# Patient Record
Sex: Female | Born: 1945 | Race: White | Hispanic: No | Marital: Married | Smoking: Current every day smoker
Health system: Southern US, Community
[De-identification: ages and names within clinical notes are randomized; demographics above are authoritative.]

## PROBLEM LIST (undated history)

## (undated) DIAGNOSIS — Z9289 Personal history of other medical treatment: Secondary | ICD-10-CM

## (undated) DIAGNOSIS — D649 Anemia, unspecified: Secondary | ICD-10-CM

## (undated) DIAGNOSIS — F419 Anxiety disorder, unspecified: Secondary | ICD-10-CM

## (undated) DIAGNOSIS — I4891 Unspecified atrial fibrillation: Secondary | ICD-10-CM

## (undated) DIAGNOSIS — M1990 Unspecified osteoarthritis, unspecified site: Secondary | ICD-10-CM

## (undated) DIAGNOSIS — E1142 Type 2 diabetes mellitus with diabetic polyneuropathy: Secondary | ICD-10-CM

## (undated) DIAGNOSIS — I10 Essential (primary) hypertension: Secondary | ICD-10-CM

## (undated) DIAGNOSIS — E119 Type 2 diabetes mellitus without complications: Secondary | ICD-10-CM

## (undated) DIAGNOSIS — I5032 Chronic diastolic (congestive) heart failure: Secondary | ICD-10-CM

---

## 1973-02-13 DIAGNOSIS — Z9289 Personal history of other medical treatment: Secondary | ICD-10-CM

## 1973-02-13 HISTORY — DX: Personal history of other medical treatment: Z92.89

## 1997-02-13 HISTORY — PX: CHOLECYSTECTOMY: SHX55

## 1998-01-11 ENCOUNTER — Encounter: Payer: Self-pay | Admitting: Emergency Medicine

## 1998-01-11 ENCOUNTER — Observation Stay (HOSPITAL_COMMUNITY): Admission: EM | Admit: 1998-01-11 | Discharge: 1998-01-12 | Payer: Self-pay | Admitting: Emergency Medicine

## 2000-02-14 HISTORY — PX: INCISION AND DRAINAGE ABSCESS: SHX5864

## 2000-08-11 ENCOUNTER — Inpatient Hospital Stay (HOSPITAL_COMMUNITY): Admission: EM | Admit: 2000-08-11 | Discharge: 2000-08-21 | Payer: Self-pay | Admitting: *Deleted

## 2000-08-11 ENCOUNTER — Encounter: Payer: Self-pay | Admitting: Family Medicine

## 2000-08-14 ENCOUNTER — Encounter: Payer: Self-pay | Admitting: Gastroenterology

## 2000-08-27 ENCOUNTER — Encounter: Admission: RE | Admit: 2000-08-27 | Discharge: 2000-08-27 | Payer: Self-pay | Admitting: Family Medicine

## 2000-08-28 ENCOUNTER — Encounter: Admission: RE | Admit: 2000-08-28 | Discharge: 2000-08-28 | Payer: Self-pay | Admitting: Family Medicine

## 2000-10-01 ENCOUNTER — Encounter: Admission: RE | Admit: 2000-10-01 | Discharge: 2000-10-01 | Payer: Self-pay | Admitting: Family Medicine

## 2001-10-10 ENCOUNTER — Encounter: Admission: RE | Admit: 2001-10-10 | Discharge: 2001-10-10 | Payer: Self-pay | Admitting: Family Medicine

## 2002-01-22 ENCOUNTER — Encounter: Admission: RE | Admit: 2002-01-22 | Discharge: 2002-01-22 | Payer: Self-pay | Admitting: Family Medicine

## 2002-06-13 ENCOUNTER — Encounter: Payer: Self-pay | Admitting: Emergency Medicine

## 2002-06-13 ENCOUNTER — Emergency Department (HOSPITAL_COMMUNITY): Admission: EM | Admit: 2002-06-13 | Discharge: 2002-06-13 | Payer: Self-pay | Admitting: Emergency Medicine

## 2003-12-21 ENCOUNTER — Encounter: Admission: RE | Admit: 2003-12-21 | Discharge: 2003-12-21 | Payer: Self-pay | Admitting: Family Medicine

## 2004-01-18 ENCOUNTER — Emergency Department (HOSPITAL_COMMUNITY): Admission: EM | Admit: 2004-01-18 | Discharge: 2004-01-18 | Payer: Self-pay | Admitting: Family Medicine

## 2004-05-10 ENCOUNTER — Ambulatory Visit: Payer: Self-pay | Admitting: Internal Medicine

## 2004-05-18 ENCOUNTER — Ambulatory Visit: Payer: Self-pay | Admitting: Internal Medicine

## 2004-06-11 ENCOUNTER — Observation Stay (HOSPITAL_COMMUNITY): Admission: EM | Admit: 2004-06-11 | Discharge: 2004-06-12 | Payer: Self-pay | Admitting: Family Medicine

## 2010-04-18 ENCOUNTER — Inpatient Hospital Stay (HOSPITAL_COMMUNITY)
Admission: AD | Admit: 2010-04-18 | Discharge: 2010-04-22 | DRG: 292 | Disposition: A | Discharge status: Home Health | Payer: Medicare Other | Source: Ambulatory Visit | Attending: Internal Medicine | Admitting: Internal Medicine

## 2010-04-18 ENCOUNTER — Inpatient Hospital Stay (HOSPITAL_COMMUNITY): Payer: Medicare Other

## 2010-04-18 DIAGNOSIS — I509 Heart failure, unspecified: Principal | ICD-10-CM | POA: Diagnosis present

## 2010-04-18 DIAGNOSIS — G8929 Other chronic pain: Secondary | ICD-10-CM | POA: Diagnosis present

## 2010-04-18 DIAGNOSIS — I279 Pulmonary heart disease, unspecified: Secondary | ICD-10-CM | POA: Diagnosis present

## 2010-04-18 DIAGNOSIS — R188 Other ascites: Secondary | ICD-10-CM | POA: Diagnosis present

## 2010-04-18 DIAGNOSIS — R627 Adult failure to thrive: Secondary | ICD-10-CM | POA: Diagnosis present

## 2010-04-18 DIAGNOSIS — I4891 Unspecified atrial fibrillation: Secondary | ICD-10-CM | POA: Diagnosis present

## 2010-04-18 DIAGNOSIS — I872 Venous insufficiency (chronic) (peripheral): Secondary | ICD-10-CM | POA: Diagnosis present

## 2010-04-18 DIAGNOSIS — E662 Morbid (severe) obesity with alveolar hypoventilation: Secondary | ICD-10-CM | POA: Diagnosis present

## 2010-04-18 DIAGNOSIS — F17200 Nicotine dependence, unspecified, uncomplicated: Secondary | ICD-10-CM | POA: Diagnosis present

## 2010-04-18 DIAGNOSIS — G4733 Obstructive sleep apnea (adult) (pediatric): Secondary | ICD-10-CM | POA: Diagnosis present

## 2010-04-18 DIAGNOSIS — F411 Generalized anxiety disorder: Secondary | ICD-10-CM | POA: Diagnosis present

## 2010-04-18 DIAGNOSIS — E1142 Type 2 diabetes mellitus with diabetic polyneuropathy: Secondary | ICD-10-CM | POA: Diagnosis present

## 2010-04-18 DIAGNOSIS — E785 Hyperlipidemia, unspecified: Secondary | ICD-10-CM | POA: Diagnosis present

## 2010-04-18 DIAGNOSIS — Z713 Dietary counseling and surveillance: Secondary | ICD-10-CM

## 2010-04-18 DIAGNOSIS — M1990 Unspecified osteoarthritis, unspecified site: Secondary | ICD-10-CM | POA: Diagnosis present

## 2010-04-18 DIAGNOSIS — IMO0002 Reserved for concepts with insufficient information to code with codable children: Secondary | ICD-10-CM | POA: Diagnosis present

## 2010-04-18 DIAGNOSIS — I10 Essential (primary) hypertension: Secondary | ICD-10-CM | POA: Diagnosis present

## 2010-04-18 DIAGNOSIS — E1149 Type 2 diabetes mellitus with other diabetic neurological complication: Secondary | ICD-10-CM | POA: Diagnosis present

## 2010-04-18 LAB — COMPREHENSIVE METABOLIC PANEL
ALT: 12 U/L (ref 0–35)
Albumin: 3.4 g/dL — ABNORMAL LOW (ref 3.5–5.2)
Alkaline Phosphatase: 87 U/L (ref 39–117)
CO2: 27 mEq/L (ref 19–32)
GFR calc Af Amer: >60 mL/min (ref >60)
GFR calc non Af Amer: 57 mL/min — ABNORMAL LOW (ref >60)
Sodium: 133 mEq/L — ABNORMAL LOW (ref 135–145)

## 2010-04-18 LAB — CBC
Platelets: 321 10*3/uL (ref 150–400)
RBC: 4.71 MIL/uL (ref 3.87–5.11)
RDW: 16.9 % — ABNORMAL HIGH (ref 11.5–15.5)

## 2010-04-18 LAB — CARDIAC PANEL(CRET KIN+CKTOT+MB+TROPI)
CK, MB: 1.1 ng/mL (ref 0.3–4.0)
Relative Index: INVALID (ref 0.0–2.5)
Total CK: 51 U/L (ref 7–177)
Troponin I: 0.01 ng/mL (ref 0.00–0.06)

## 2010-04-18 LAB — BRAIN NATRIURETIC PEPTIDE: Pro B Natriuretic peptide (BNP): 118 pg/mL — ABNORMAL HIGH (ref 0.0–100.0)

## 2010-04-18 LAB — PROTIME-INR
INR: 1.69 — ABNORMAL HIGH (ref 0.00–1.49)
Prothrombin Time: 20.1 seconds — ABNORMAL HIGH (ref 11.6–15.2)

## 2010-04-19 ENCOUNTER — Inpatient Hospital Stay (HOSPITAL_COMMUNITY): Payer: Medicare Other

## 2010-04-19 LAB — GLUCOSE, CAPILLARY
Glucose-Capillary: 329 mg/dL — ABNORMAL HIGH (ref 70–99)
Glucose-Capillary: 227 mg/dL — ABNORMAL HIGH (ref 70–99)

## 2010-04-19 LAB — T4, FREE: Free T4: 1 ng/dL (ref 0.80–1.80)

## 2010-04-19 LAB — LIPID PANEL
Total CHOL/HDL Ratio: 4.2 RATIO
VLDL: 32 mg/dL (ref 0–40)

## 2010-04-19 LAB — PROTIME-INR: INR: 1.7 — ABNORMAL HIGH (ref 0.00–1.49)

## 2010-04-20 ENCOUNTER — Inpatient Hospital Stay (HOSPITAL_COMMUNITY): Payer: Medicare Other

## 2010-04-20 LAB — GLUCOSE, CAPILLARY
Glucose-Capillary: 218 mg/dL — ABNORMAL HIGH (ref 70–99)
Glucose-Capillary: 263 mg/dL — ABNORMAL HIGH (ref 70–99)
Glucose-Capillary: 249 mg/dL — ABNORMAL HIGH (ref 70–99)
Glucose-Capillary: 222 mg/dL — ABNORMAL HIGH (ref 70–99)

## 2010-04-20 LAB — COMPREHENSIVE METABOLIC PANEL
AST: 19 U/L (ref 0–37)
CO2: 30 mEq/L (ref 19–32)
Calcium: 8.9 mg/dL (ref 8.4–10.5)
Chloride: 93 mEq/L — ABNORMAL LOW (ref 96–112)
Creatinine, Ser: 0.79 mg/dL (ref 0.4–1.2)
Sodium: 139 mEq/L (ref 135–145)
Total Bilirubin: 0.9 mg/dL (ref 0.3–1.2)

## 2010-04-20 LAB — CBC
HCT: 39.2 % (ref 36.0–46.0)
Hemoglobin: 11.9 g/dL — ABNORMAL LOW (ref 12.0–15.0)
Platelets: 309 10*3/uL (ref 150–400)
WBC: 10 10*3/uL (ref 4.0–10.5)

## 2010-04-21 LAB — COMPREHENSIVE METABOLIC PANEL
ALT: 11 U/L (ref 0–35)
AST: 22 U/L (ref 0–37)
Albumin: 3.3 g/dL — ABNORMAL LOW (ref 3.5–5.2)
CO2: 30 mEq/L (ref 19–32)
Calcium: 8.8 mg/dL (ref 8.4–10.5)
GFR calc Af Amer: >60 mL/min (ref >60)
GFR calc non Af Amer: >60 mL/min (ref >60)
Sodium: 133 mEq/L — ABNORMAL LOW (ref 135–145)

## 2010-04-21 LAB — CBC
HCT: 39.3 % (ref 36.0–46.0)
MCV: 85.6 fL (ref 78.0–100.0)
Platelets: 292 10*3/uL (ref 150–400)
RBC: 4.59 MIL/uL (ref 3.87–5.11)
WBC: 9.1 10*3/uL (ref 4.0–10.5)

## 2010-04-21 LAB — IRON AND TIBC
Iron: 52 ug/dL (ref 42–135)
Saturation Ratios: 12 % — ABNORMAL LOW (ref 20–55)
TIBC: 428 ug/dL (ref 250–470)
UIBC: 376 ug/dL

## 2010-04-21 LAB — GLUCOSE, CAPILLARY

## 2010-04-21 NOTE — H&P (Signed)
NAME:  Joy Benitez, Joy Benitez NO.:  [id number]  MEDICAL RECORD NO.:  [id number]           PATIENT TYPE:  I  LOCATION:  4713                         FACILITY:  [facility]  PHYSICIAN:  Gwen Pounds, MD       DATE OF BIRTH:  September 21, 1945  DATE OF ADMISSION:  04/18/2010 DATE OF DISCHARGE:                             HISTORY & PHYSICAL   CHIEF COMPLAINT:  Volume overload, congestive heart failure, anasarca.  [name] is a 65 year old female with numerous medical problems who I have been trying to admit to the hospital and tune up for the last several months, and she saw me every time.  She presents today with one of her daughter's good friends insisting that we admit her today.  This is a message also come from the daughter, and the patient is ready for admission for evaluation and treatment.  She has been having increasing lower extremity edema, increasing full body edema, increasing ascites, increasing anasarca, increasing lower extremity excoriations, and increasing weeping in and around the umbilicus and the legs.  Her left side hurts, her abdomen hurts from being so distended. She has been having difficulty moving.  She is feeling poor.  She is very sick a couple of weeks ago according to the daughter's friend who is in the room with her and had 102 fever and confusion.  She is just not doing very well.  Increased SOB and DOE.  She will be admitted for further evaluation of her recent diagnosis of AFib with RVR, recent diagnosis of worsening volume overload, presumed obstructive sleep apnea, obesity hypoventilation syndrome, and congestive heart failure.  She will be aggressively IV diuresed.  PAST MEDICAL HISTORY: 1. Type 2 diabetes with neuropathy. 2. Osteoarthritis. 3. Smoker. 4. Hypertension. 5. Hyperlipidemia. 6. Severe venous insufficiency with lower extremity edema. 7. History of recurrent cellulitis. 8. Chronic pain. 9. Low back  pain. 10.Degenerative disk disease. 11.History of groin abscess. 12.Morbid obesity. 13.Chronic urticaria. 14.Anxiety. 15.Atrial fibrillation.  Last echocardiogram showed normal EF, significant pulmonary hypertension, pulmonary vein enlargement, left ventricular hypertrophy.  SURGICAL HISTORY:  C-section x2, LAP CHOLE in 1999.  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  FAMILY HISTORY:  Father died at the age of 9 of coronary artery disease and MI.  Mother 84, maybe even older, he does not know her very well. Siblings have coronary artery disease and bypass surgery.  SOCIAL HISTORY:  She is married to her second husband, has two children, two grandchildren.  She is a retired, disabled Interior and spatial designer.  She smokes one pack per day.  ALLERGIES:  LIPITOR, VIOXX, ALTACE, ACE INHIBITORS, LEVAQUIN, and MORPHINE.  MEDICATION LIST: 1. Metformin 1000 mg p.o. b.i.d. 2. Neurontin 600 mg t.i.d. 3. NovoLog 70/30 28 units b.i.d. 4. Verapamil 180 mg per day. 5. Xanax 1 tablet p.o. q.12 h. 6. Lasix 20 mg 3 tablets once a day. 7. Doxepin 25 mg one to two p.o. p.r.n. for itching. 8. Vicodin 500  as needed per Dr. Ethelene Hal. 9. Coumadin 5 mg daily except 1-1/2 tablets for 7.5 mg every Wednesday     and Sunday.  REVIEW OF SYSTEMS:  Positive for dyspnea on exertion, peripheral edema, fatigue, malaise, difficulty sleeping, nasal congestion, coughing, dyspnea, incontinence, urinary frequency, stiffness, arthritis, depression, anxiety.  All other review of systems is within the handwritten H and P and otherwise negative.  PHYSICAL EXAMINATION:  VITAL SIGNS:  356 pounds, height 62 inches, pulse is 95, blood pressure 148/86, A1c is 8.8, BMI is 65.  She has gained 5 pounds in the last month.  She is sating 95%  on room air. GENERAL:  She is well nourished.  She is well hydrated.  She is in no distress, but she is chronically ill-appearing, and she is short of breath with any movement. HEENT:  Eyes:  PERRL.  Oropharynx is clear.  Dental exam, very poor did dentition. NECK:  No JVD, but she has a very thick neck and makes it very difficult to determine. RESPIRATORY:  No rales or rhonchi noted, but distant breath sounds, and she smells of tobacco. CARDIOVASCULAR:  Irregularly irregular, some rapidness.  No murmur, gallop, or rub.  Peripheral circulation is anasarca, 3+ edema bilaterally, excoriations noted, venous stasis and changes noted, blisters noted, weeping noted. ABDOMEN:  Soft, nontender, no masses.  Bowel sounds are normal.  She is obese.  Her abdomen is tense.  There is some weeping edema around her umbilicus. LYMPH NODES:  No cervical lymphadenopathy noted. MUSCULOSKELETAL:  She is walking with a cane.  She had a normal alignment and mobility, but she is slow and waddles.  Strength seems appropriate for her. SKIN:  She is slightly ashen.  She has lesions on her leg.  ASSESSMENT:  This is an elderly female who is much older than stated age being admitted with anasarca, severe pulmonary hypertension for further evaluation and treatment. 1. For the pulmonary hypertension, we are going to admit, put her on     telemetry.  We are going to attempt to push weight loss, we are     going to attempt to get her off the tobacco as this is making     things worse.  We are going to do an overnight oximetry that may     mimic a sleep study which i believe would be positive that she has obstructive sleep     apnea.  Her current symptoms today seemed very consistent with     significant right congestive heart failure.  She is do not     resuscitate and actually requested that I put her on morphine drip     and allow her to pass away.  We are obviously not going to do that.     We are going to tune  her up and improve her current quality and     current function. 2. For her current atrial fibrillation with history of rapid     ventricular response.  I have her on verapamil, which     she is currently off as I am admitting her to the hospital as I     know this is certainly contributing to her edema.  If her heart     rate increases, then the medicine I think we will use will be     digoxin or cardizem as I do think beta blockade is not very good option due to     her underlying presumed chronic obstructive pulmonary disease.  We     will also check an INR and adjust to keep the INR between 2.0 and     3.0.  She did have an echocardiogram done in November 2011 actually     found atrial fibrillation with rapid ventricular response.  The     echocardiogram was technically difficult, but did show normal     ejection fraction, moderate-to-severe pulmonary hypertension, right     ventricular enlargement, and left ventricular hypertrophy.  These     diagnosis go along with obstructive sleep apnea and chronic     obstructive pulmonary disease and other presumed diagnoses.  I     asked her to please quit the tobacco and wanted to set up a split     night titration study for obstructive sleep apnea due to the     pulmonary retention.  She said she feels fine and felt it was     unnecessary and did not want to pursue this.  I also wanted to     pursue sending her to Cardiology for further evaluation and     treatment, and she decline that at this time. 3. Cardiomegaly, cardiomyopathy with congestive heart failure.  We     will admit and pursue a cardiac workup.  We will also pursue IV     Lasix, pursue labs, diurese as appropriate, and provide medical     care.  We will also diurese and address the legs and try to reduce     the edema. 4. For her type 2 diabetes mellitus, we will hold the metformin and     treat with sliding scale insulin.  Her A1c is horrible.  Her blood     pressure is  horrible.  We will try to titrate diabetes under better     control. 5. For anxiety, we will use Xanax p.r.n. and continue to counsel her     on depression and promote better life style and better living and     remind her that she does have a very caring family that wants to     see her survive and live and have a better quality of life than she     currently has at this time. 6. For her chronic venous insufficiency, again we will diurese. 7. For her smoking, she actually needs to quit. 8. For hyperlipidemia, we will check a lipid level in the hospital. 9. Hypertension.  We will diurese and again the verapamil will be on     hold overnight and decide whether we need to use it and we will     follow blood pressure, adjust as needed. 10.Lumbar back pain, recently saw Dr. Ethelene Hal and certainly has been     treated.  This is getting worse.  Her legs are starting to hurt     worse.  I think Dr. Ethelene Hal has talked to her about Neurontin,     Robaxin, Norco, and potentially getting rid of the edema and     finally getting her medical health taking care of prior to pursuing     any further orthopedic solutions. 11.For her obesity, she actually needs weight loss. 12.She will also get Nutrition consult.  We will also cover her legs     in Cetaphil, wrap in Kerlix and Ace bandage, and get wound care to     evaluate the legs.  We will have activity to get her up with     assistance and get Physical  Therapy/Occupational Therapy, and case     management involved.  We will keep legs elevated.  We will get an     abdominal ultrasound tomorrow to see if any ascites and to see if     we need to do a paracentesis.  We will get a chest x-ray and EKG on     arrival.  We will follow up on significant amount of labs upon     arrival. 13.We will place her on oxygen and watch her potassium carefully.     Gwen Pounds, MD     JMR/MEDQ  D:  04/18/2010  T:  04/19/2010  Job:  [id number]  cc:   Caralyn Guile. Ethelene Hal,  M.D.  Electronically Signed by [name] MD on 04/19/2010 09:17:00 PM

## 2010-04-22 LAB — COMPREHENSIVE METABOLIC PANEL
ALT: 12 U/L (ref 0–35)
AST: 21 U/L (ref 0–37)
Albumin: 3.2 g/dL — ABNORMAL LOW (ref 3.5–5.2)
CO2: 32 mEq/L (ref 19–32)
Chloride: 94 mEq/L — ABNORMAL LOW (ref 96–112)
GFR calc Af Amer: >60 mL/min (ref >60)
GFR calc non Af Amer: >60 mL/min (ref >60)
Sodium: 135 mEq/L (ref 135–145)
Total Bilirubin: 0.8 mg/dL (ref 0.3–1.2)

## 2010-04-22 LAB — CBC
Hemoglobin: 11.8 g/dL — ABNORMAL LOW (ref 12.0–15.0)
Platelets: 305 10*3/uL (ref 150–400)
RBC: 4.43 MIL/uL (ref 3.87–5.11)
WBC: 8.8 10*3/uL (ref 4.0–10.5)

## 2010-04-22 LAB — PROTIME-INR
INR: 1.74 — ABNORMAL HIGH (ref 0.00–1.49)
Prothrombin Time: 20.5 seconds — ABNORMAL HIGH (ref 11.6–15.2)

## 2010-05-15 NOTE — Discharge Summary (Signed)
NAME:  Joy Benitez, Joy Benitez NO.:  [id number]  MEDICAL RECORD NO.:  [id number]           PATIENT TYPE:  I  LOCATION:  4713                         FACILITY:  [facility]  PHYSICIAN:  Gwen Pounds, MD       DATE OF BIRTH:  [birth year]  DATE OF ADMISSION:  04/18/2010 DATE OF DISCHARGE:  04/22/2010                              DISCHARGE SUMMARY   PRIMARY CARE PROVIDER:  Gwen Pounds, MD  DISCHARGE DIAGNOSES: 1. Anasarca with volume overload, improving. 2. Pulmonary hypertension. 3. Presumed severe obstructive sleep apnea with obesity     hypoventilation syndrome. 4. Right congestive heart failure. 5. Atrial fibrillation. 6. Poorly-controlled type 2 diabetes. 7. Morbid obesity. 8. Do not resuscitate. 9. Numerous excoriated skin lesions, self-inflicted. 10.Smoking. 11.Hypertension. 12.Hyperlipidemia. 13.Low back pain. 14.Neuropathy. 15.Osteoarthritis. 16.Venous insufficiency with chronic lower extremity edema. 17.History recurrent cellulitis. 18.Chronic pain. 19.Degenerative disk disease. 20.History of extensive groin abscess. 21.Chronic urticaria. 22.Anxiety.  DISCHARGE PROCEDURES:  Medical management, IV Lasix, and diuresis.  X- ray of the chest on April 18, 2010 shows low lung volumes with cardiomegaly but no congestive heart failure, cannot exclude right infrahilar atelectasis or infection, decreased sensitivity in specificity due to the fact that it was portable, abdominal ultrasound dated March 7 revealed post cholecystectomy, pancreas inadequately evaluated, relatively large kidneys without other obvious pathology, no acute or specific findings.  No ascites was seen.  DISCHARGE MEDICATION LIST: 1. Cetaphil topically as needed with bilateral lower extremities and     then wrap to keep covered. 2. Digoxin 0.62 mg by mouth daily. 3. Diltiazem CD 120 mg by mouth daily.  This is to replace  the     verapamil. 4. Potassium chloride 20 mEq 2 tablets by mouth  daily. 5. Lasix 4 tablets by mouth twice daily. 6. Coumadin 5 mg by mouth every day. 7. Doxepin 25 mg by mouth daily as needed. 8. Gabapentin 600 mg t.i.d. 9. Humalog 28 units subcu every evening. 10.Hydrocodone/APAP 10/325 one tablet q.6 h. as needed for pain. 11.Metformin 1000 mg by mouth twice daily. 12.Xanax 1 mg 1 tablet by mouth 3 times daily as needed.  DISCHARGE INSTRUCTIONS:  ACTIVITY:  Her activity is to increase her activity slowly, walk with cane, avoid falls.  DISCHARGE DIET:  Low-calorie low-sodium, heart-healthy low-carbohydrate diet as provided by Nutritional Services.  FOLLOWUP APPOINTMENTS:  Follow up with Dr. Timothy Lasso in 2 weeks and Dr. Olegario Messier at [facility] next week.  DISCHARGE INSTRUCTIONS:  Keep weight off her low back as it starting to hurt, quit smoking, lose weight, take care herself better than she has been, and keep legs wrapped in cover and stop scratching.  HISTORY OF PRESENT ILLNESS:  Briefly, Joy Benitez is a 65 year old female with various medical issues with atrial fibrillation with recent visits to the office with AFib and RVR, recent Coumadin start. Workup included a 2-D echocardiogram which showed normal EF, significant pulmonary hypertension, pulmonary vein enlargement, and left ventricular hypertrophy.  She continues to gain weight and presented to medical attention on April 18, 2010 with significant volume overload.  She could not walk very far without  getting short of breath.  Due to volume overload, she has significant weeping edema from her umbilicus from her legs, from all over.  She was failing to thrive.  Because of the edema of the lower extremities and her neuropathy and her chronic urticaria, she was scratching to the point of creating ulcers on her legs.  She had anasarca and presumed ascites at the time.  She had increasing lower abdominal and rib pain from being so distended.  She was admitted for further  evaluation and treatment.  Joy Benitez is an elderly female who appears much older than stated age, being admitted with anasarca, severe pulmonary hypertension, for further evaluation and treatment.  For the pulmonary hypertension and anasarca, we admitted her, put on telemetry, and we attempted to push weight loss through a combination of dietary maneuvers, decrease volume intake and aggressive diuresis.  She has not smoked in 5 days and hopefully will not restart once she goes home.  She did get counseling on how to quit smoking. We attempted to do an overnight oximetry study which was grossly positive.  She ripped the tag of her offer finger because of it beeped all night and kept her awake.  The assumption was that she was hypoxic and this is a big issue for her.  On discussion about potentially wearing the "mask" (CPAP) she adamantly says that she does not want this and will not wear it.  On admission, she requested to be a do not resuscitate and that was honored.  I attempted to get Cardiology involved to help in the medication tune-up and she requested that both the Cardiology PA and the Cardiology attending that she did not want any other physicians involved in her care besides me.  Because of the atrial fibrillation and rapid ventricular response, the verapamil was discontinued, and she was placed on low dose Cardizem, also placed on low-dose digoxin and her heart rate remained controlled throughout the hospital stay.  She remained on Coumadin with a goal INR between 2.0 and 3.0.  Beta-blockade was not continued due to the feeling that she might have underlying COPD.  ACE inhibitor and ARB are currently not given due to lowest blood pressures and trying to further titrate her medications to effect and these medicines could be started as an outpatient.  As for her type 2 diabetes, we held metformin and placed on sliding scale insulin and NPH.  Her A1C is horrible.  On  discharge,  her last 24 hours of blood sugars have been in the 200 range and I will attempt to titrate up her Lantus and talked her about going on 70/30 as an outpatient.  As for hypertension control has been pretty poor as an outpatient and has certainly been excellent while in the hospital.  As for her lumbar back pain, she recently saw Dr. Ethelene Hal and certainly has been treated for her back pain.  Her legs are starting to hurt worse and her back is starting to hurt worse.  Dr. Ethelene Hal has talked to her about her Neurontin dose or Robaxin or Norco and potentially getting rid of the edema and getting her medically tuned up prior to there being anything else that he can recommend.  She was seen by Wound consult on April 19, 2010, with generalized edema, bilateral lower extremity, patchy areas of skin and partial-thickness skin loss and moderate yellow drainage without odor, affected areas extends from the feet to the calves, no significant measurable open wounds, macerations in  the left foot and right posterior calf.  Plan is to continue the Cetaphil, Kerlix, and Ace wraps and place on Mepilex Border to the areas of drainage and the plan not to follow, this seems to be helping especially with the diuresis.  As for her tobacco cessation, the patient smokes one-pack per day and is willing to try to quit.  The recommendation was 21 mg patch x6 weeks followed by 14 mg patch for 2 weeks and 7 mg patch for 2 weeks for total of 10 weeks.  They discussed patchy use instructions and referral was given to 1-800-Quit-Now.  Also discussion was on secondhand smoke and home policies.  As stated before, she was not interested in getting Cardiology involved in her care.  Nutrition was consulted on April 19, 2010, and the recommendations was for low sodium diet from the congestive heart failure book, and the patient discussed importance of limiting sodium, how to read food labels, substitutions cooking  without salt.  The patient was able to verbalize understanding, but did not seem motivated to make the changes at this time.  They recommended outpatient referral for further low- sodium education and reinforcement.  She was seen by Physical Therapy and Case Management and there is no further physical therapy needs, but the Case Management discussed the discharge planning and home health nursing.  The patient does not feel as if she needs home health nursing for congestive heart failure management but would consent to home health RN to help manage leg wound and I do agree with this and I will write that order.  She eventually had trouble with Foley catheter and that was removed.  She did have abdominal ultrasound that did not show ascites, there is nothing to tap.  She did have problems with her INR, they remained relatively low throughout the hospital stay and this can be further managed as an outpatient.  Her labs on the date of discharge showed a BNP of 103. CMET, sodium 135, potassium 3.4, chloride 94, bicarb 32, BUN 16, creatinine 0.76 with a glucose of 245.  Liver tests were within normal limits except for albumin of 3.2.  CBC showed white count 8.8, hemoglobin 11.8, platelet count 305, INR on the date of discharge 1.74. Ferritin was 53 done yesterday with anemia profile showing that  normal iron store but the percent sat of 12, free T4 was normal at 1.0 on March 6, total T3 was slightly low at 61.6 on March 6, this was following a higher end of normal, TSH of 4.184.  Her liver profile showed total cholesterol 162, triglyceride 162, HDL 39, LDL 91, and no other pertinent labs were drawn throughout the hospital stay.  So the current plan is to discharge the patient in stable condition on April 22, 2010.  All of her vital signs are stable.  Her blood sugars were high. Her admission weight in my office was 161.48 kg that correlates with second weight that maybe drawn here, it is  currently 156.3 kg which correlates to attend the 15-pound weight loss from the fluid alone without any corresponding bump in creatinine, therefore she is tolerating this well.  She will be discharged with continued diuresis by oral medications as an outpatient with encouragement to continue loose more weight with encouragement to stay quit from her smoking with encouragement to consider doing CPAP as an outpatient to follow up accordingly for rate and INR checks as an outpatient and push insulin and Glucophage as far as we can  go and continue to work on taking better care of herself.     Gwen Pounds, MD     JMR/MEDQ  D:  04/22/2010  T:  04/23/2010  Job:  [id number]  cc:   Dr. Ethelene Hal  Electronically Signed by [name] MD on 05/15/2010 10:30:36 AM

## 2011-02-14 HISTORY — PX: CATARACT EXTRACTION W/ INTRAOCULAR LENS IMPLANT: SHX1309

## 2011-09-15 ENCOUNTER — Telehealth (HOSPITAL_COMMUNITY): Payer: Self-pay | Admitting: Cardiology

## 2011-09-15 NOTE — Telephone Encounter (Signed)
Pt called to request a refill on Plavix [facility]

## 2011-09-15 NOTE — Telephone Encounter (Signed)
Number in chart not in service, pt has not been seen here in the office. Unable to reach pt or leave a message

## 2013-04-29 ENCOUNTER — Other Ambulatory Visit: Payer: Self-pay | Admitting: Internal Medicine

## 2013-04-29 DIAGNOSIS — R188 Other ascites: Secondary | ICD-10-CM

## 2013-04-29 DIAGNOSIS — I509 Heart failure, unspecified: Secondary | ICD-10-CM

## 2013-05-01 ENCOUNTER — Other Ambulatory Visit (HOSPITAL_COMMUNITY): Payer: Medicare Other

## 2013-05-05 ENCOUNTER — Ambulatory Visit
Admission: RE | Admit: 2013-05-05 | Discharge: 2013-05-05 | Disposition: A | Discharge status: Home / Self Care | Payer: Medicare Other | Source: Ambulatory Visit | Attending: Internal Medicine | Admitting: Internal Medicine

## 2013-05-05 DIAGNOSIS — I509 Heart failure, unspecified: Secondary | ICD-10-CM

## 2013-05-05 DIAGNOSIS — R188 Other ascites: Secondary | ICD-10-CM

## 2013-11-04 ENCOUNTER — Encounter (HOSPITAL_COMMUNITY): Payer: Self-pay | Admitting: Emergency Medicine

## 2013-11-04 ENCOUNTER — Inpatient Hospital Stay (HOSPITAL_COMMUNITY)
Admission: EM | Admit: 2013-11-04 | Discharge: 2013-11-12 | DRG: 602 | Disposition: A | Discharge status: Skilled Nursing Facility | Payer: Medicare Other | Attending: Internal Medicine | Admitting: Internal Medicine

## 2013-11-04 ENCOUNTER — Emergency Department (HOSPITAL_COMMUNITY): Payer: Medicare Other

## 2013-11-04 DIAGNOSIS — E1142 Type 2 diabetes mellitus with diabetic polyneuropathy: Secondary | ICD-10-CM | POA: Diagnosis present

## 2013-11-04 DIAGNOSIS — I4891 Unspecified atrial fibrillation: Secondary | ICD-10-CM | POA: Diagnosis present

## 2013-11-04 DIAGNOSIS — D649 Anemia, unspecified: Secondary | ICD-10-CM | POA: Diagnosis present

## 2013-11-04 DIAGNOSIS — I5030 Unspecified diastolic (congestive) heart failure: Secondary | ICD-10-CM | POA: Diagnosis present

## 2013-11-04 DIAGNOSIS — I2789 Other specified pulmonary heart diseases: Secondary | ICD-10-CM | POA: Diagnosis present

## 2013-11-04 DIAGNOSIS — G4730 Sleep apnea, unspecified: Secondary | ICD-10-CM | POA: Diagnosis present

## 2013-11-04 DIAGNOSIS — M793 Panniculitis, unspecified: Secondary | ICD-10-CM | POA: Diagnosis not present

## 2013-11-04 DIAGNOSIS — L03119 Cellulitis of unspecified part of limb: Secondary | ICD-10-CM

## 2013-11-04 DIAGNOSIS — I959 Hypotension, unspecified: Secondary | ICD-10-CM | POA: Diagnosis not present

## 2013-11-04 DIAGNOSIS — F17200 Nicotine dependence, unspecified, uncomplicated: Secondary | ICD-10-CM | POA: Diagnosis present

## 2013-11-04 DIAGNOSIS — I10 Essential (primary) hypertension: Secondary | ICD-10-CM | POA: Diagnosis present

## 2013-11-04 DIAGNOSIS — L02419 Cutaneous abscess of limb, unspecified: Secondary | ICD-10-CM | POA: Diagnosis present

## 2013-11-04 DIAGNOSIS — IMO0002 Reserved for concepts with insufficient information to code with codable children: Secondary | ICD-10-CM | POA: Diagnosis present

## 2013-11-04 DIAGNOSIS — I509 Heart failure, unspecified: Secondary | ICD-10-CM | POA: Diagnosis present

## 2013-11-04 DIAGNOSIS — E46 Unspecified protein-calorie malnutrition: Secondary | ICD-10-CM | POA: Diagnosis present

## 2013-11-04 DIAGNOSIS — I2781 Cor pulmonale (chronic): Secondary | ICD-10-CM | POA: Diagnosis present

## 2013-11-04 DIAGNOSIS — R000 Tachycardia, unspecified: Secondary | ICD-10-CM

## 2013-11-04 DIAGNOSIS — E1165 Type 2 diabetes mellitus with hyperglycemia: Secondary | ICD-10-CM

## 2013-11-04 DIAGNOSIS — J449 Chronic obstructive pulmonary disease, unspecified: Secondary | ICD-10-CM | POA: Diagnosis present

## 2013-11-04 DIAGNOSIS — I272 Pulmonary hypertension, unspecified: Secondary | ICD-10-CM | POA: Diagnosis present

## 2013-11-04 DIAGNOSIS — Z6841 Body Mass Index (BMI) 40.0 and over, adult: Secondary | ICD-10-CM

## 2013-11-04 DIAGNOSIS — M129 Arthropathy, unspecified: Secondary | ICD-10-CM | POA: Diagnosis present

## 2013-11-04 DIAGNOSIS — E785 Hyperlipidemia, unspecified: Secondary | ICD-10-CM | POA: Diagnosis present

## 2013-11-04 DIAGNOSIS — J189 Pneumonia, unspecified organism: Secondary | ICD-10-CM | POA: Diagnosis present

## 2013-11-04 DIAGNOSIS — E1149 Type 2 diabetes mellitus with other diabetic neurological complication: Secondary | ICD-10-CM | POA: Diagnosis present

## 2013-11-04 DIAGNOSIS — I872 Venous insufficiency (chronic) (peripheral): Secondary | ICD-10-CM | POA: Diagnosis present

## 2013-11-04 DIAGNOSIS — E1140 Type 2 diabetes mellitus with diabetic neuropathy, unspecified: Secondary | ICD-10-CM | POA: Diagnosis present

## 2013-11-04 DIAGNOSIS — J4489 Other specified chronic obstructive pulmonary disease: Secondary | ICD-10-CM | POA: Diagnosis present

## 2013-11-04 HISTORY — DX: Type 2 diabetes mellitus without complications: E11.9

## 2013-11-04 HISTORY — DX: Personal history of other medical treatment: Z92.89

## 2013-11-04 HISTORY — DX: Anxiety disorder, unspecified: F41.9

## 2013-11-04 HISTORY — DX: Unspecified atrial fibrillation: I48.91

## 2013-11-04 HISTORY — DX: Anemia, unspecified: D64.9

## 2013-11-04 HISTORY — DX: Type 2 diabetes mellitus with diabetic polyneuropathy: E11.42

## 2013-11-04 HISTORY — DX: Essential (primary) hypertension: I10

## 2013-11-04 HISTORY — DX: Unspecified osteoarthritis, unspecified site: M19.90

## 2013-11-04 LAB — URINALYSIS, ROUTINE W REFLEX MICROSCOPIC
Glucose, UA: NEGATIVE mg/dL
Hgb urine dipstick: NEGATIVE
Ketones, ur: 15 mg/dL — AB
NITRITE: NEGATIVE
PH: 5 (ref 5.0–8.0)
Protein, ur: 30 mg/dL — AB
SPECIFIC GRAVITY, URINE: 1.022 (ref 1.005–1.030)
Urobilinogen, UA: 2 mg/dL — ABNORMAL HIGH (ref 0.0–1.0)

## 2013-11-04 LAB — CBC WITH DIFFERENTIAL/PLATELET
BASOS PCT: 0 % (ref 0–1)
Basophils Absolute: 0 10*3/uL (ref 0.0–0.1)
EOS ABS: 0.1 10*3/uL (ref 0.0–0.7)
Eosinophils Relative: 1 % (ref 0–5)
HCT: 33.9 % — ABNORMAL LOW (ref 36.0–46.0)
Hemoglobin: 10.6 g/dL — ABNORMAL LOW (ref 12.0–15.0)
LYMPHS ABS: 1.6 10*3/uL (ref 0.7–4.0)
Lymphocytes Relative: 12 % (ref 12–46)
MCH: 25.4 pg — AB (ref 26.0–34.0)
MCHC: 31.3 g/dL (ref 30.0–36.0)
MCV: 81.3 fL (ref 78.0–100.0)
Monocytes Absolute: 1.2 10*3/uL — ABNORMAL HIGH (ref 0.1–1.0)
Monocytes Relative: 9 % (ref 3–12)
NEUTROS PCT: 78 % — AB (ref 43–77)
Neutro Abs: 9.9 10*3/uL — ABNORMAL HIGH (ref 1.7–7.7)
PLATELETS: 327 10*3/uL (ref 150–400)
RBC: 4.17 MIL/uL (ref 3.87–5.11)
RDW: 17 % — ABNORMAL HIGH (ref 11.5–15.5)
WBC: 12.7 10*3/uL — ABNORMAL HIGH (ref 4.0–10.5)

## 2013-11-04 LAB — RETICULOCYTES
RBC.: 4.05 MIL/uL (ref 3.87–5.11)
RETIC CT PCT: 2.4 % (ref 0.4–3.1)
Retic Count, Absolute: 97.2 10*3/uL (ref 19.0–186.0)

## 2013-11-04 LAB — URINE MICROSCOPIC-ADD ON

## 2013-11-04 LAB — I-STAT TROPONIN, ED: Troponin i, poc: 0.1 ng/mL (ref 0.00–0.08)

## 2013-11-04 LAB — COMPREHENSIVE METABOLIC PANEL
ALBUMIN: 3.1 g/dL — AB (ref 3.5–5.2)
ALK PHOS: 92 U/L (ref 39–117)
ALT: 7 U/L (ref 0–35)
AST: 18 U/L (ref 0–37)
Anion gap: 15 (ref 5–15)
BUN: 19 mg/dL (ref 6–23)
CO2: 25 mEq/L (ref 19–32)
Calcium: 9 mg/dL (ref 8.4–10.5)
Chloride: 94 mEq/L — ABNORMAL LOW (ref 96–112)
Creatinine, Ser: 1.03 mg/dL (ref 0.50–1.10)
GFR calc Af Amer: 65 mL/min — ABNORMAL LOW (ref >90)
GFR calc non Af Amer: 56 mL/min — ABNORMAL LOW (ref >90)
GLUCOSE: 213 mg/dL — AB (ref 70–99)
POTASSIUM: 3.8 meq/L (ref 3.7–5.3)
SODIUM: 134 meq/L — AB (ref 137–147)
TOTAL PROTEIN: 7.8 g/dL (ref 6.0–8.3)
Total Bilirubin: 2.1 mg/dL — ABNORMAL HIGH (ref 0.3–1.2)

## 2013-11-04 LAB — TROPONIN I: Troponin I: <0.3 ng/mL (ref <0.30)

## 2013-11-04 MED ORDER — VANCOMYCIN HCL IN DEXTROSE 1-5 GM/200ML-% IV SOLN
1000.0000 mg | Freq: Once | INTRAVENOUS | Status: AC
  Administered 2013-11-04: 1000 mg via INTRAVENOUS
  Filled 2013-11-04: qty 200

## 2013-11-04 MED ORDER — ACETAMINOPHEN 325 MG PO TABS
650.0000 mg | ORAL_TABLET | Freq: Four times a day (QID) | ORAL | Status: DC | PRN
  Administered 2013-11-05 – 2013-11-11 (×3): 650 mg via ORAL
  Filled 2013-11-04 (×3): qty 2

## 2013-11-04 MED ORDER — DILTIAZEM HCL ER COATED BEADS 240 MG PO CP24
240.0000 mg | ORAL_CAPSULE | Freq: Every day | ORAL | Status: DC
  Administered 2013-11-05 – 2013-11-11 (×7): 240 mg via ORAL
  Filled 2013-11-04 (×9): qty 1

## 2013-11-04 MED ORDER — INSULIN ASPART 100 UNIT/ML ~~LOC~~ SOLN
4.0000 [IU] | Freq: Three times a day (TID) | SUBCUTANEOUS | Status: DC
  Administered 2013-11-05 – 2013-11-12 (×19): 4 [IU] via SUBCUTANEOUS

## 2013-11-04 MED ORDER — INSULIN ASPART 100 UNIT/ML ~~LOC~~ SOLN
0.0000 [IU] | Freq: Every day | SUBCUTANEOUS | Status: DC
  Administered 2013-11-05 – 2013-11-07 (×4): 2 [IU] via SUBCUTANEOUS

## 2013-11-04 MED ORDER — INSULIN ASPART 100 UNIT/ML ~~LOC~~ SOLN: 0.0000 [IU] | Freq: Three times a day (TID) | SUBCUTANEOUS | Status: DC

## 2013-11-04 MED ORDER — INSULIN GLARGINE 100 UNIT/ML ~~LOC~~ SOLN
15.0000 [IU] | Freq: Every day | SUBCUTANEOUS | Status: DC
Start: 2013-11-04 — End: 2013-11-08
  Administered 2013-11-05 – 2013-11-07 (×3): 15 [IU] via SUBCUTANEOUS
  Filled 2013-11-04 (×5): qty 0.15

## 2013-11-04 NOTE — ED Provider Notes (Signed)
See prior note   Ward Givens, MD 11/04/13 2202

## 2013-11-04 NOTE — ED Provider Notes (Addendum)
Patient reports she has chronic swelling of her legs however she has been picking on her legs recently. She states yesterday she started getting more red and swollen. She also complains of swelling and redness of her lower abdomen and states when she moves her abdomen hurts.  Patient is obese, she is noted to have diffuse redness of the lower portion of her abdomen with some orange peeling changes. She has diffuse redness and swelling of her lower extremities with some scabbing seen in areas that are draining clear fluid. Please see photos.         Medical screening examination/treatment/procedure(s) were conducted as a shared visit with non-physician practitioner(s) and myself.  I personally evaluated the patient during the encounter.   EKG Interpretation   Date/Time:  Tuesday November 04 2013 18:57:26 EDT Ventricular Rate:  115 PR Interval:    QRS Duration: 105 QT Interval:  320 QTC Calculation: 443 R Axis:   134 Text Interpretation:  Right and left arm electrode reversal,  interpretation assumes no reversal Atrial fibrillation with rapid  ventricular response Right axis deviation Low voltage, precordial leads  Borderline repolarization abnormality No significant change since last  tracing 18 Apr 2010 Confirmed by [facility]  MD-I, Camrin Lapre ([id number]) on 11/04/2013  7:26:20 PM       Devoria Albe, MD, Franz Dell, MD 11/04/13 2108  Ward Givens, MD 11/04/13 2109

## 2013-11-04 NOTE — H&P (Signed)
PCP:   Lavone Neri   Chief Complaint:  Leg swelling  HPI: 68 YO WF with pulm htn, chronic R CHF, poorly controlled DM, presents with leg swelling and fever. This has been present for several days. No OV has occurred. She has worsening pain and firmness of her large overhanging pannus. Her LE have been more edematous and have gotten more red. She is chronically SOB but has been coughing more. She continues to smoke Lower sugar in the past several days is 120.  Highest has been 300. She denies fevers, chills, or sweats.  Her bowels have been okay.  She denies cardiac chest pain.  She is felt bad all day today.  Review of Systems:  Review of Systems - weight has been stable.  She's had no low blood sugars. Past Medical History: Past Medical History  Diagnosis Date  . Diabetes mellitus without complication   . Atrial fibrillation   . Arthritis   . Blood transfusion without reported diagnosis   . Hypertension   Past Medical History (reviewed - no changes required): DM2 with Neuropathy, OA, Smoker, HTN, Hyperlipidemia, CVI/LE edema, Chronic pain/LBP/DDD, H/O Cellulitis, H/O Groin abscess, Morbid obesity, Urticaria, Anxiety, AFIB.  H/O Admission for Cellulitis 2006  ECHO. Nml EF. Pulm HTN. PV Enlargement. LVH. = R Heart Failure  LLE cellulitis 10/31/11 - Dr Lestine Box took out L Great toenail.  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  S/P Perineal debridement and drainage of multiple abscesses, Stryker irrigation and packing.  e.      Medications: Prior to Admission medications   Medication Sig Start Date End Date Taking? Authorizing Provider  ALPRAZolam Prudy Feeler) 1 MG tablet Take 1 mg by mouth 2 (two) times daily. 10/01/13   Historical Provider, MD  CARTIA XT 120 MG 24 hr capsule Take 120 mg by mouth daily. 10/08/13   Historical  Provider, MD  DIGOX 125 MCG tablet Take 125 mcg by mouth. 10/29/13   Historical Provider, MD  furosemide (LASIX) 20 MG tablet  10/08/13   Historical Provider, MD  gabapentin (NEURONTIN) 300 MG capsule  10/14/13   Historical Provider, MD  metFORMIN (GLUCOPHAGE) 500 MG tablet Take 500 mg by mouth 2 (two) times daily. 10/29/13   Historical Provider, MD  spironolactone (ALDACTONE) 25 MG tablet Take 25 mg by mouth daily. 10/10/13   Historical Provider, MD    Allergies:   Allergies  Allergen Reactions  . Levaquin [Levofloxacin In D5w]     Social History:  .  Marland KitchenSocial History (reviewed - no changes required): Married to 2nd husband, 2 children, 2 grandchildren.  Retired Interior and spatial designer (disabled).  Smoker (1 ppd), ND.  Family History: Family History (reviewed - no changes required): Father deceased at 46 (CAD, MI).  Mother 45 (doesn't know her well).  Siblings: CAD, CABG  Brother Junious Dresser) died Acute Renal Failur  Physical Exam: Filed Vitals:   11/04/13 1915 11/04/13 1930 11/04/13 1949 11/04/13 2000  BP: 111/88 103/78 103/78 106/78  Pulse: 72 100 115 110  Temp:   98.2 F (36.8 C)   TempSrc:   Oral   Resp: 20 23 18 24   Height:      Weight:      SpO2: 93% 95% 97% 96%   General appearance: obese chronically ill WF with appearance older than stated age  sclera anicteric, no nystagmus, face symmetric Oral membranes  moist Neck: no adenopathy, no carotid bruit, no JVD and thyroid not enlarged, symmetric, no tenderness/mass/nodules Resp: reduced bilat, some wheeze, no accessory ms in use Cardio: rapid IR IR GI: massive indurated reddened pannus, reduced BS's Extremities:4+ edema with cobblestone skin changes, ulcer on top of left foot, fungal nails, warm to the touch bilat  Neurologic: awake alert, mentating OK, no tremor 18th of system and skin tags are present  Labs on Admission:   Recent Labs  11/04/13 1913  NA 134*  K 3.8  CL 94*  CO2 25  GLUCOSE 213*  BUN 19  CREATININE 1.03   CALCIUM 9.0    Recent Labs  11/04/13 1913  AST 18  ALT 7  ALKPHOS 92  BILITOT 2.1*  PROT 7.8  ALBUMIN 3.1*     Recent Labs  11/04/13 1913  WBC 12.7*  NEUTROABS 9.9*  HGB 10.6*  HCT 33.9*  MCV 81.3  PLT 327    Recent Labs  11/04/13 1913  TROPONINI <0.30       Radiological Exams on Admission: Dg Chest 2 View  11/04/2013   CLINICAL DATA:  Generalized weakness.  Initial encounter  EXAM: CHEST  2 VIEW  COMPARISON:  04/18/2010  FINDINGS: Chronic cardiopericardial enlargement. Aortic contours are unchanged.  There is cephalized blood flow and diffuse interstitial coarsening. Opacification is most dense at the right base. No significant effusion. No pneumothorax.  IMPRESSION: 1. Cardiomegaly and mild pulmonary edema. 2. Asymmetric opacity at the right base; cannot exclude pneumonia at this location.   Electronically Signed   By: Tiburcio Pea M.D.   On: 11/04/2013 20:55   Orders placed during the hospital encounter of 11/04/13  . EKG 12-LEAD Right and left arm electrode reversal, interpretation assumes no reversal Atrial fibrillation with rapid ventricular response Right axis deviation Low voltage, precordial leads Borderline repolarization abnormality No significant change since last tracing 18 Apr 2010  . EKG 12-LEAD    Assessment/Plan Principal Problem:   Acute panniculitis: this is an extensive problem.  This likely been present for a while.  There's no way to tell if there is underlying abscess in the abdomen or abdominal wall.  CT scan may be required.  Would treat aggressively with Zosyn and vancomycin.  She does have a leukocytosis.  She is not septic at the present time. Active Problems:   Atrial fibrillation with tachycardic ventricular rate: continue oral doses of Cardizem double her normal dose and give some Lovenox.  She likely needs long-term anticoagulation. A point of care troponin was elevated but regular lab draw was normal.  Repeat will be done in  the morning   Community acquired pneumonia; this is a somewhat soft call placed on the chest x-ray.  She is a smoker and does have high risk for pneumonia. The above antibiotic should help this.   Type II or unspecified type diabetes mellitus with neurological manifestations, uncontrolled: we'll hold metformin for now and give her basal insulin with mealtime insulin.  I suspect she is chronically poorly controlled.   Polyneuropathy in diabetes(357.2); she has lack of protective sensation   Morbid obesity; clearly this is a long-standing problem   Cellulitis and abscess of leg; IT COULD BE some infection of the legs as well.   Cor pulmonale, chronic/  Pulmonary hypertension; we may use this opportunity look at an echo.  It is almost certain that she has severe sleep apnea and should be on CPAP.   Anemia; we will send an anemia panel Low albumin:  This may be due to renal disease   Mirza Fessel [name] 11/04/2013, 10:30 PM

## 2013-11-04 NOTE — ED Provider Notes (Addendum)
CSN: [id number]     Arrival date & time 11/04/13  4098 History   First MD Initiated Contact with Patient 11/04/13 1859     Chief Complaint  Patient presents with  . arial fibrillation      (Consider location/radiation/quality/duration/timing/severity/associated sxs/prior Treatment) The history is provided by the patient, medical records, the EMS personnel and the spouse. No language interpreter was used.    Joy Benitez is a 68 y.o. female  with a hx of DM, a-fib, HTN presents to the Emergency Department complaining of gradual, persistent, progressively worsening swelling of the lower legs as a chronic problem, but she reports they became redder yesterday.  She reports "picking at them" causing the wounds.  She also reports swelling and pain in her lower abd that is painful when she moves without nausea, vomiting or diarrhea.  Pt reports generalized weakness for the past 3 days.  Nothing makes her symptoms better or worse. She denies fever, chills, headache, neck pain, chest pain, SOB, abd pain, N/V/D syncope, dysuria.  No aggravating or alleviating factors.   PCP: [name]  Past Medical History  Diagnosis Date  . Diabetes mellitus without complication   . Atrial fibrillation   . Arthritis   . Blood transfusion without reported diagnosis   . Hypertension    History reviewed. No pertinent past surgical history. History reviewed. No pertinent family history. History  Substance Use Topics  . Smoking status: Current Every Day Smoker -- 0.50 packs/day    Types: Cigarettes  . Smokeless tobacco: Not on file  . Alcohol Use: No   OB History   Grav Para Term Preterm Abortions TAB SAB Ect Mult Living                 Review of Systems  Constitutional: Negative for fever, diaphoresis, appetite change, fatigue and unexpected weight change.  HENT: Negative for mouth sores.   Eyes: Negative for visual disturbance.  Respiratory: Negative for cough, chest tightness, shortness of  breath and wheezing.   Cardiovascular: Positive for leg swelling. Negative for chest pain.  Gastrointestinal: Positive for abdominal pain (with movement). Negative for nausea, vomiting, diarrhea and constipation.  Endocrine: Negative for polydipsia, polyphagia and polyuria.  Genitourinary: Negative for dysuria, urgency, frequency and hematuria.  Musculoskeletal: Negative for back pain and neck stiffness.  Skin: Positive for color change (bilateral legs). Negative for rash.  Allergic/Immunologic: Negative for immunocompromised state.  Neurological: Negative for syncope, light-headedness and headaches.  Hematological: Does not bruise/bleed easily.  Psychiatric/Behavioral: Negative for sleep disturbance. The patient is not nervous/anxious.       Allergies  Levaquin  Home Medications   Prior to Admission medications   Not on File   BP 106/78  Pulse 110  Temp(Src) 98.2 F (36.8 C) (Oral)  Resp 24  Ht  (1.626 m)  Wt 330 lb (149.687 kg)  BMI 56.62 kg/m2  SpO2 96% Physical Exam  Nursing note and vitals reviewed. Constitutional: She appears well-developed and well-nourished. No distress.  Awake, alert, nontoxic appearance Obese  HENT:  Head: Normocephalic and atraumatic.  Mouth/Throat: Oropharynx is clear and moist. No oropharyngeal exudate.  Eyes: Conjunctivae are normal. No scleral icterus.  Neck: Normal range of motion. Neck supple.  Cardiovascular: Regular rhythm, normal heart sounds and intact distal pulses.   tachycardia  Pulmonary/Chest: Effort normal. No accessory muscle usage. Not tachypneic. No respiratory distress. She has decreased breath sounds. She has no wheezes.  Equal chest expansion Diminished throughout  Abdominal: Soft. Bowel sounds  are normal. She exhibits no mass. There is tenderness. There is no rebound and no guarding.  Panniculitis with Peau d'orange skin changes and several lesions, likely excoriations; no deep abdominal tenderness   Musculoskeletal: Normal range of motion. She exhibits no edema.  Neurological: She is alert.  Speech is clear and goal oriented Moves extremities without ataxia  Skin: Skin is warm and dry. She is not diaphoretic.  Erythema with pitting edema and induration covering the bilateral lower terminates with numerous wounds  Psychiatric: She has a normal mood and affect.    ED Course  Procedures (including critical care time) Labs Review Labs Reviewed  CBC WITH DIFFERENTIAL - Abnormal; Notable for the following:    WBC 12.7 (*)    Hemoglobin 10.6 (*)    HCT 33.9 (*)    MCH 25.4 (*)    RDW 17.0 (*)    Neutrophils Relative % 78 (*)    Neutro Abs 9.9 (*)    Monocytes Absolute 1.2 (*)    All other components within normal limits  COMPREHENSIVE METABOLIC PANEL - Abnormal; Notable for the following:    Sodium 134 (*)    Chloride 94 (*)    Glucose, Bld 213 (*)    Albumin 3.1 (*)    Total Bilirubin 2.1 (*)    GFR calc non Af Amer 56 (*)    GFR calc Af Amer 65 (*)    All other components within normal limits  URINALYSIS, ROUTINE W REFLEX MICROSCOPIC - Abnormal; Notable for the following:    Color, Urine ORANGE (*)    APPearance CLOUDY (*)    Bilirubin Urine MODERATE (*)    Ketones, ur 15 (*)    Protein, ur 30 (*)    Urobilinogen, UA 2.0 (*)    Leukocytes, UA SMALL (*)    All other components within normal limits  URINE MICROSCOPIC-ADD ON - Abnormal; Notable for the following:    Squamous Epithelial / LPF MANY (*)    Bacteria, UA MANY (*)    All other components within normal limits  I-STAT TROPOININ, ED - Abnormal; Notable for the following:    Troponin i, poc 0.10 (*)    All other components within normal limits  URINE CULTURE  CULTURE, BLOOD (ROUTINE X 2)  TROPONIN I    Imaging Review Dg Chest 2 View  11/04/2013   CLINICAL DATA:  Generalized weakness.  Initial encounter  EXAM: CHEST  2 VIEW  COMPARISON:  04/18/2010  FINDINGS: Chronic cardiopericardial enlargement. Aortic  contours are unchanged.  There is cephalized blood flow and diffuse interstitial coarsening. Opacification is most dense at the right base. No significant effusion. No pneumothorax.  IMPRESSION: 1. Cardiomegaly and mild pulmonary edema. 2. Asymmetric opacity at the right base; cannot exclude pneumonia at this location.   Electronically Signed   By: Tiburcio Pea M.D.   On: 11/04/2013 20:55     EKG Interpretation   Date/Time:  Tuesday November 04 2013 18:57:26 EDT Ventricular Rate:  115 PR Interval:    QRS Duration: 105 QT Interval:  320 QTC Calculation: 443 R Axis:   134 Text Interpretation:  Right and left arm electrode reversal,  interpretation assumes no reversal Atrial fibrillation with rapid  ventricular response Right axis deviation Low voltage, precordial leads  Borderline repolarization abnormality No significant change since last  tracing 18 Apr 2010 Confirmed by [facility]  MD-I, IVA ([id number]) on 11/04/2013  7:26:20 PM      MDM   Final diagnoses:  Cellulitis  of lower leg  Panniculitis  Tachycardia   MAYELA BULLARD presents with clinical exam consistent with cellulitis of the lower legs and panniculitis of the abdomen.  Patient tachycardic with low-grade fever.  Patient denies chest pain or shortness of breath however complains generalized weakness. We'll begin workup and vancomycin for her cellulitis. We'll plan for admission.  Patient with history of A. fib and A. fib seen on EKG today.  9:46 PM Patient leukocytosis at 12.7, elevated i-STAT troponin at 0.1 however normal troponin less than 0.3. Urinalysis without clear evidence of urinary tract infection as it was contaminated, will send urine culture.  Chest x-ray with asymmetric opacity at the right base perhaps pneumonia.  Patient remains mildly tachycardic; patient given maintenance fluids so as not to exacerbate the mild pulmonary edema is present.  She is not hypotensive.  The patient was discussed with and seen by  Dr. Devoria Albe who agrees with the treatment plan.  9:53 PM Discussed with Dr. Evlyn Kanner of [facility] who will admit.    BP 106/78  Pulse 110  Temp(Src) 98.2 F (36.8 C) (Oral)  Resp 24  Ht  (1.626 m)  Wt 330 lb (149.687 kg)  BMI 56.62 kg/m2  SpO2 96%   Dierdre Forth, PA-C 11/04/13 2154  Dierdre Forth, PA-C 11/04/13 2215

## 2013-11-04 NOTE — ED Provider Notes (Signed)
See prior note   Ward Givens, MD 11/04/13 2216

## 2013-11-04 NOTE — ED Notes (Addendum)
Per EMS, pt comes from home with several complaints from husband. Pt A&OX4, NAD noted. Pt denies chest pain, SOB or nausea/vomitting. Pt c/o severe pain in BLE, lower back and abd. Pt has h/o Afib and diabetes Tyoe II. Pitting edema noted in BLE and open sores from "picking." VS: BP 96/58, P87, R20, CBG 308.

## 2013-11-05 DIAGNOSIS — I5030 Unspecified diastolic (congestive) heart failure: Secondary | ICD-10-CM | POA: Diagnosis present

## 2013-11-05 DIAGNOSIS — F17200 Nicotine dependence, unspecified, uncomplicated: Secondary | ICD-10-CM | POA: Diagnosis present

## 2013-11-05 DIAGNOSIS — J449 Chronic obstructive pulmonary disease, unspecified: Secondary | ICD-10-CM | POA: Diagnosis present

## 2013-11-05 DIAGNOSIS — I379 Nonrheumatic pulmonary valve disorder, unspecified: Secondary | ICD-10-CM

## 2013-11-05 LAB — COMPREHENSIVE METABOLIC PANEL
ALBUMIN: 2.9 g/dL — AB (ref 3.5–5.2)
ALK PHOS: 88 U/L (ref 39–117)
ALT: 7 U/L (ref 0–35)
AST: 19 U/L (ref 0–37)
Anion gap: 14 (ref 5–15)
BUN: 20 mg/dL (ref 6–23)
CO2: 25 mEq/L (ref 19–32)
Calcium: 8.8 mg/dL (ref 8.4–10.5)
Chloride: 95 mEq/L — ABNORMAL LOW (ref 96–112)
Creatinine, Ser: 1.06 mg/dL (ref 0.50–1.10)
GFR calc non Af Amer: 54 mL/min — ABNORMAL LOW (ref >90)
GFR, EST AFRICAN AMERICAN: 62 mL/min — AB (ref >90)
Glucose, Bld: 192 mg/dL — ABNORMAL HIGH (ref 70–99)
POTASSIUM: 3.6 meq/L — AB (ref 3.7–5.3)
SODIUM: 134 meq/L — AB (ref 137–147)
TOTAL PROTEIN: 7.3 g/dL (ref 6.0–8.3)
Total Bilirubin: 2.1 mg/dL — ABNORMAL HIGH (ref 0.3–1.2)

## 2013-11-05 LAB — CBC
HEMATOCRIT: 32.2 % — AB (ref 36.0–46.0)
HEMOGLOBIN: 10.2 g/dL — AB (ref 12.0–15.0)
MCH: 25.5 pg — AB (ref 26.0–34.0)
MCHC: 31.7 g/dL (ref 30.0–36.0)
MCV: 80.5 fL (ref 78.0–100.0)
Platelets: 297 10*3/uL (ref 150–400)
RBC: 4 MIL/uL (ref 3.87–5.11)
RDW: 16.8 % — ABNORMAL HIGH (ref 11.5–15.5)
WBC: 11.5 10*3/uL — ABNORMAL HIGH (ref 4.0–10.5)

## 2013-11-05 LAB — GLUCOSE, CAPILLARY
GLUCOSE-CAPILLARY: 236 mg/dL — AB (ref 70–99)
Glucose-Capillary: 210 mg/dL — ABNORMAL HIGH (ref 70–99)

## 2013-11-05 LAB — TROPONIN I: Troponin I: <0.3 ng/mL (ref <0.30)

## 2013-11-05 LAB — IRON AND TIBC
Iron: 32 ug/dL — ABNORMAL LOW (ref 42–135)
SATURATION RATIOS: 7 % — AB (ref 20–55)
TIBC: 444 ug/dL (ref 250–470)
UIBC: 412 ug/dL — AB (ref 125–400)

## 2013-11-05 LAB — FOLATE: Folate: 8 ng/mL

## 2013-11-05 LAB — HEMOGLOBIN A1C
Hgb A1c MFr Bld: 7.1 % — ABNORMAL HIGH (ref <5.7)
Mean Plasma Glucose: 157 mg/dL — ABNORMAL HIGH (ref <117)

## 2013-11-05 LAB — VITAMIN B12: Vitamin B-12: 507 pg/mL (ref 211–911)

## 2013-11-05 LAB — FERRITIN: FERRITIN: 46 ng/mL (ref 10–291)

## 2013-11-05 MED ORDER — ALPRAZOLAM 0.5 MG PO TABS
1.0000 mg | ORAL_TABLET | Freq: Two times a day (BID) | ORAL | Status: DC
  Administered 2013-11-05 – 2013-11-09 (×9): 1 mg via ORAL
  Filled 2013-11-05 (×9): qty 2

## 2013-11-05 MED ORDER — MUPIROCIN CALCIUM 2 % EX CREA
TOPICAL_CREAM | Freq: Two times a day (BID) | CUTANEOUS | Status: DC
  Administered 2013-11-05 – 2013-11-12 (×11): via TOPICAL
  Filled 2013-11-05: qty 15

## 2013-11-05 MED ORDER — DIGOXIN 125 MCG PO TABS
0.1250 mg | ORAL_TABLET | Freq: Every day | ORAL | Status: DC
  Administered 2013-11-05 – 2013-11-12 (×8): 0.125 mg via ORAL
  Filled 2013-11-05 (×8): qty 1

## 2013-11-05 MED ORDER — ENOXAPARIN SODIUM 40 MG/0.4ML ~~LOC~~ SOLN
40.0000 mg | Freq: Two times a day (BID) | SUBCUTANEOUS | Status: DC
  Administered 2013-11-05 – 2013-11-12 (×16): 40 mg via SUBCUTANEOUS
  Filled 2013-11-05 (×20): qty 0.4

## 2013-11-05 MED ORDER — FUROSEMIDE 10 MG/ML IJ SOLN
40.0000 mg | Freq: Two times a day (BID) | INTRAMUSCULAR | Status: DC
  Administered 2013-11-05 – 2013-11-09 (×11): 40 mg via INTRAVENOUS
  Filled 2013-11-05 (×11): qty 4

## 2013-11-05 MED ORDER — PIPERACILLIN-TAZOBACTAM 3.375 G IVPB
3.3750 g | Freq: Three times a day (TID) | INTRAVENOUS | Status: DC
  Administered 2013-11-05 – 2013-11-09 (×15): 3.375 g via INTRAVENOUS
  Filled 2013-11-05 (×16): qty 50

## 2013-11-05 MED ORDER — INSULIN ASPART 100 UNIT/ML ~~LOC~~ SOLN
0.0000 [IU] | Freq: Three times a day (TID) | SUBCUTANEOUS | Status: DC
  Administered 2013-11-05 (×3): 5 [IU] via SUBCUTANEOUS
  Administered 2013-11-06 (×3): 3 [IU] via SUBCUTANEOUS
  Administered 2013-11-07: 5 [IU] via SUBCUTANEOUS
  Administered 2013-11-07 – 2013-11-08 (×3): 3 [IU] via SUBCUTANEOUS
  Administered 2013-11-08 (×2): 5 [IU] via SUBCUTANEOUS
  Administered 2013-11-09: 3 [IU] via SUBCUTANEOUS
  Administered 2013-11-09 – 2013-11-10 (×3): 2 [IU] via SUBCUTANEOUS
  Administered 2013-11-10 – 2013-11-11 (×4): 3 [IU] via SUBCUTANEOUS
  Administered 2013-11-11 – 2013-11-12 (×3): 2 [IU] via SUBCUTANEOUS

## 2013-11-05 MED ORDER — INFLUENZA VAC SPLIT QUAD 0.5 ML IM SUSY
0.5000 mL | PREFILLED_SYRINGE | INTRAMUSCULAR | Status: AC
  Administered 2013-11-06: 0.5 mL via INTRAMUSCULAR
  Filled 2013-11-05: qty 0.5

## 2013-11-05 MED ORDER — ALBUTEROL SULFATE (2.5 MG/3ML) 0.083% IN NEBU: 3.0000 mL | INHALATION_SOLUTION | Freq: Four times a day (QID) | RESPIRATORY_TRACT | Status: DC | PRN

## 2013-11-05 MED ORDER — TRAMADOL HCL 50 MG PO TABS
50.0000 mg | ORAL_TABLET | Freq: Four times a day (QID) | ORAL | Status: DC | PRN
  Administered 2013-11-05 – 2013-11-08 (×2): 50 mg via ORAL
  Filled 2013-11-05 (×2): qty 1

## 2013-11-05 MED ORDER — PIPERACILLIN-TAZOBACTAM 3.375 G IVPB 30 MIN: 3.3750 g | Freq: Three times a day (TID) | INTRAVENOUS | Status: DC

## 2013-11-05 MED ORDER — SPIRONOLACTONE 25 MG PO TABS
25.0000 mg | ORAL_TABLET | Freq: Every day | ORAL | Status: DC
  Administered 2013-11-05 – 2013-11-12 (×8): 25 mg via ORAL
  Filled 2013-11-05 (×8): qty 1

## 2013-11-05 MED ORDER — VANCOMYCIN HCL IN DEXTROSE 1-5 GM/200ML-% IV SOLN
1000.0000 mg | Freq: Three times a day (TID) | INTRAVENOUS | Status: DC
  Administered 2013-11-05 – 2013-11-07 (×7): 1000 mg via INTRAVENOUS
  Filled 2013-11-05 (×10): qty 200

## 2013-11-05 MED ORDER — HYDROCERIN EX CREA
TOPICAL_CREAM | Freq: Every day | CUTANEOUS | Status: DC
  Administered 2013-11-05 – 2013-11-12 (×8): via TOPICAL
  Filled 2013-11-05: qty 113

## 2013-11-05 MED ORDER — SODIUM CHLORIDE 0.9 % IV SOLN
INTRAVENOUS | Status: DC
  Administered 2013-11-05 – 2013-11-06 (×2): via INTRAVENOUS

## 2013-11-05 MED ORDER — GABAPENTIN 100 MG PO CAPS
200.0000 mg | ORAL_CAPSULE | Freq: Two times a day (BID) | ORAL | Status: DC
  Administered 2013-11-05 – 2013-11-09 (×10): 200 mg via ORAL
  Filled 2013-11-05 (×13): qty 2

## 2013-11-05 NOTE — Care Management Note (Addendum)
  Page 2 of 2   11/12/2013     10:52:41 AM CARE MANAGEMENT NOTE 11/12/2013  Patient:  Joy Benitez, Joy Benitez   Account Number:  [id number]  Date Initiated:  11/05/2013  Documentation initiated by:  Corvin Sorbo  Subjective/Objective Assessment:   Admitted with acute panniculitis, cellulitis,pulm htn, chronic R CHF, poorly controlled DM, presents with leg swelling and fever     Action/Plan:   CM to follow for dispositon needs   Anticipated DC Date:  11/12/2013   Anticipated DC Plan:  SKILLED NURSING FACILITY      DC Planning Services  CM consult  Other      Choice offered to / List presented to:             Status of service:  Completed, signed off Medicare Important Message given?  YES (If response is "NO", the following Medicare IM given date fields will be blank) Date Medicare IM given:  11/12/2013 Medicare IM given by:  Godson Pollan Date Additional Medicare IM given:  11/10/2013 Additional Medicare IM given by:  Lujean Ebright  Discharge Disposition:  SKILLED NURSING FACILITY  Per UR Regulation:  Reviewed for med. necessity/level of care/duration of stay  If discussed at Long Length of Stay Meetings, dates discussed:    Comments:  Elleanor Guyett RN, BSN, MSHL, CCM  Nurse - Case Manager,  (Unit [phone]  11/12/2013 Dispo Plan:  SNF:  [facility] See Agustine Rossitto RN, BSN, MSHL, CCM  Nurse - Case Manager,  (Unit [phone]  11/07/2013 Remains on oxygen Delirium mgmt TC from patients husband: Husband states he is not able to take patient home d/t fear of patient falling and inability to stand/walk on her own. States it took 6 people to get her out of the house on admission.  Husband states he is disabled and unable to provide the level of care needs patient has at this time. PT:  Please reassess patients mobility status. Dispo Plan:  Possible SNF - Dispo plan pending.   Marissa Lowrey RN, BSN, MSHL, CCM  Nurse - Case Manager,   (Unit [place])  [phone]  11/07/2013 Social:  from home with family CM discussed recs for Sleep Study evaluation and appt scheduling.  Patient refused stating she has no plans to complete a sleep study or wear CPap.  Patient states she sleeps just fine. PT Recs:  None Dispo Plan:  Home / Self Care.

## 2013-11-05 NOTE — Progress Notes (Signed)
UR completed Emmarose Klinke K. Murat Rideout, RN, BSN, MSHL, CCM  11/05/2013 4:05 PM

## 2013-11-05 NOTE — Consult Note (Addendum)
WOC wound consult note Reason for Consult: Consult requested for abd and legs.  Pt has large pannus with cellulitis and generalized erythremia.  Currently there are no open wounds.  Inner abd with mod amt moisture and partial thickness breakdown, appearance consistent with intertrigo.   Wound type:Bilat legs with edema and erythremia; skin appearance consistent with chronic lymphadenia and venous stasis changes and Peau' de orange skin.  Patchy areas of partial thickness wounds: Left anterior ankle .3X2.5X.1cm dry brown wound bed, small amt yellow drainage, no odor.   Right shin .8X1X.1cm, and .5X.5X.1cm, dry brown wound bed, small amt yellow drainage, no odor.  Measurement: Left anterior foot with full thickness wound; 1X1X.2cm, mod amt yellow drainage, no odor. Dressing procedure/placement/frequency: Interdry silver-impregnated fabric to wick away moisture from skin and provide antimicrobial benefits in pannus skin folds.  This should be left in place for 5 days for optimal plan of care.  Supplies ordered and instructions provided for bedside nurse use.    Bactroban to promote healing to partial thickness leg wounds.  Protect with foam dressings to absorb drainage.  Eucerin cream to provide moist healing to skin surrounding wounds on bilat legs.  Aquacel to absorb drainage and provide antimicrobial benefits to left foot wound.  Discussed plan of care with pt and she verbalizes understanding. Please re-consult if further assistance is needed.  Thank-you,  Cammie Mcgee MSN, RN, CWOCN, [place], CNS [phone]

## 2013-11-05 NOTE — Progress Notes (Signed)
Inpatient Diabetes Program Recommendations  AACE/ADA: New Consensus Statement on Inpatient Glycemic Control (2013)  Target Ranges:  Prepandial:   less than 140 mg/dL      Peak postprandial:   less than 180 mg/dL (1-2 hours)      Critically ill patients:  140 - 180 mg/dL     Results for Joy Benitez, Joy Benitez (MRN [id number]) as of 11/05/2013 12:31  Ref. Range 11/05/2013 00:11 11/05/2013 07:41  Glucose-Capillary Latest Range: 70-99 mg/dL 915 (H) 056 (H)    Results for Joy Benitez, Joy Benitez (MRN [id number]) as of 11/05/2013 12:31  Ref. Range 11/04/2013 23:04  Hemoglobin A1C Latest Range: <5.7 % 7.1 (H)     Admitted with Panniculitis, LE swelling.  History of DM, CHF  Home DM Meds: Metformin 500 mg bid  Current Orders:  Lantus 15 units QHS (to start tonight) Novolog 4 units tid with meals Novolog Moderate SSI tid ac + HS    **A1c shows decent control at home on Metformin alone  **Note that patient having glucose elevations likely due to infectious process.  Insulin has been initiated in hospital.  **Patient scheduled to get 1st dose of Lantus tonight    Will follow Ambrose Finland RN, MSN, CDE Diabetes Coordinator Inpatient Diabetes Program Team Pager: [phone] (8a-10p)

## 2013-11-05 NOTE — Progress Notes (Signed)
  Echocardiogram 2D Echocardiogram has been performed.  Leta Jungling M 11/05/2013, 9:23 AM

## 2013-11-05 NOTE — Progress Notes (Signed)
ANTIBIOTIC CONSULT NOTE - INITIAL  Pharmacy Consult for Vancomycin Indication: cellulitis  Allergies  Allergen Reactions  . Levaquin [Levofloxacin In D5w]     Patient Measurements: Height: 5\' 4"  (162.6 cm) Weight: 332 lb 0.2 oz (150.6 kg) IBW/kg (Calculated) : 54.7 Adjusted Body Weight: 95 kg  Vital Signs: Temp: 100.4 F (38 C) (09/23 0014) Temp src: Oral (09/23 0014) BP: 119/80 mmHg (09/23 0014) Pulse Rate: 115 (09/22 2245) Intake/Output from previous day:   Intake/Output from this shift:    Labs:  Recent Labs  11/04/13 1913  WBC 12.7*  HGB 10.6*  PLT 327  CREATININE 1.03   Estimated Creatinine Clearance: 80 ml/min (by C-G formula based on Cr of 1.03). No results found for this basename: VANCOTROUGH, VANCOPEAK, VANCORANDOM, GENTTROUGH, GENTPEAK, GENTRANDOM, TOBRATROUGH, TOBRAPEAK, TOBRARND, AMIKACINPEAK, AMIKACINTROU, AMIKACIN,  in the last 72 hours   Microbiology: No results found for this or any previous visit (from the past 720 hour(s)).  Medical History: Past Medical History  Diagnosis Date  . Diabetes mellitus without complication   . Atrial fibrillation   . Arthritis   . Blood transfusion without reported diagnosis   . Hypertension     Medications:  Prescriptions prior to admission  Medication Sig Dispense Refill  . ALPRAZolam (XANAX) 1 MG tablet Take 1 mg by mouth 2 (two) times daily.      Marland Kitchen CARTIA XT 120 MG 24 hr capsule Take 120 mg by mouth daily.      Marland Kitchen DIGOX 125 MCG tablet Take 125 mcg by mouth.      . furosemide (LASIX) 20 MG tablet       . gabapentin (NEURONTIN) 300 MG capsule       . metFORMIN (GLUCOPHAGE) 500 MG tablet Take 500 mg by mouth 2 (two) times daily.      Marland Kitchen spironolactone (ALDACTONE) 25 MG tablet Take 25 mg by mouth daily.       Assessment: 68 yo female with panniculitis for empiric antibiotics  Vancomycin 1 g IV given in ED at  2200  Goal of Therapy:  Vancomycin trough level 10-15 mcg/ml  Plan:  Vancomycin 1 g IV  q8h  Eddie Candle 11/05/2013,12:50 AM

## 2013-11-05 NOTE — Progress Notes (Signed)
Subjective: 66 F c MMP admitted 7/22 c Panniculitis, Leukocytosis, Cellulitis, PNA and AFIb RVR Tm 100.4 overnight. She has MMP and stops me from being aggressive c her. She is in pain from B Legs She says her Ab is better. B LE red    Objective: Vital signs in last 24 hours: Temp:  [98.2 F (36.8 C)-100.4 F (38 C)] 98.8 F (37.1 C) (09/23 0503) Pulse Rate:  [72-115] 103 (09/23 0503) Resp:  [18-24] 20 (09/23 0503) BP: (101-119)/(69-88) 105/75 mmHg (09/23 0503) SpO2:  [92 %-98 %] 98 % (09/23 0503) Weight:  [149.687 kg (330 lb)-150.6 kg (332 lb 0.2 oz)] 150.6 kg (332 lb 0.2 oz) (09/23 0014) Weight change:  Last BM Date: 11/04/13  CBG (last 3)   Recent Labs  11/05/13 0011  GLUCAP 236*    Intake/Output from previous day:  Intake/Output Summary (Last 24 hours) at 11/05/13 0752 Last data filed at 11/05/13 9753  Gross per 24 hour  Intake      0 ml  Output    250 ml  Net   -250 ml   09/22 0701 - 09/23 0700 In: -  Out: 250 [Urine:250]   Physical Exam  General appearance: Gen poor health Eyes: no scleral icterus Throat: oropharynx moist without erythema Resp: Distant rhonchi Cardio: Irreg, m GI: Hard at lower Ab mild panniculitis - soft, non-tender; bowel sounds normal; no masses,  no organomegaly Extremities: Red irritated excoriated.   Lab Results:  Recent Labs  11/04/13 1913 11/05/13 0520  NA 134* 134*  K 3.8 3.6*  CL 94* 95*  CO2 25 25  GLUCOSE 213* 192*  BUN 19 20  CREATININE 1.03 1.06  CALCIUM 9.0 8.8     Recent Labs  11/04/13 1913 11/05/13 0520  AST 18 19  ALT 7 7  ALKPHOS 92 88  BILITOT 2.1* 2.1*  PROT 7.8 7.3  ALBUMIN 3.1* 2.9*     Recent Labs  11/04/13 1913 11/05/13 0520  WBC 12.7* 11.5*  NEUTROABS 9.9*  --   HGB 10.6* 10.2*  HCT 33.9* 32.2*  MCV 81.3 80.5  PLT 327 297    Lab Results  Component Value Date   INR 1.74* 04/22/2010   INR 1.59* 04/21/2010   INR 1.62* 04/20/2010     Recent Labs  11/04/13 1913  11/05/13 0520  TROPONINI <0.30 <0.30    No results found for this basename: TSH, T4TOTAL, FREET3, T3FREE, THYROIDAB,  in the last 72 hours   Recent Labs  11/04/13 2304  RETICCTPCT 2.4    Micro Results: No results found for this or any previous visit (from the past 240 hour(s)).   Studies/Results: Dg Chest 2 View  11/04/2013   CLINICAL DATA:  Generalized weakness.  Initial encounter  EXAM: CHEST  2 VIEW  COMPARISON:  04/18/2010  FINDINGS: Chronic cardiopericardial enlargement. Aortic contours are unchanged.  There is cephalized blood flow and diffuse interstitial coarsening. Opacification is most dense at the right base. No significant effusion. No pneumothorax.  IMPRESSION: 1. Cardiomegaly and mild pulmonary edema. 2. Asymmetric opacity at the right base; cannot exclude pneumonia at this location.   Electronically Signed   By: Tiburcio Pea M.D.   On: 11/04/2013 20:55     Medications: Scheduled: . ALPRAZolam  1 mg Oral BID  . digoxin  0.125 mg Oral Daily  . diltiazem  240 mg Oral Daily  . enoxaparin (LOVENOX) injection  40 mg Subcutaneous Q12H  . gabapentin  200 mg Oral BID  . insulin  aspart  0-15 Units Subcutaneous TID WC  . insulin aspart  0-5 Units Subcutaneous QHS  . insulin aspart  4 Units Subcutaneous TID WC  . insulin glargine  15 Units Subcutaneous QHS  . piperacillin-tazobactam (ZOSYN)  IV  3.375 g Intravenous Q8H  . spironolactone  25 mg Oral Daily  . vancomycin  1,000 mg Intravenous Q8H   Continuous: . sodium chloride 50 mL/hr at 11/05/13 0018     Assessment/Plan: Principal Problem:   Acute panniculitis Active Problems:   Atrial fibrillation with tachycardic ventricular rate   Community acquired pneumonia   Type II or unspecified type diabetes mellitus with neurological manifestations, uncontrolled   Polyneuropathy in diabetes(357.2)   Morbid obesity   Cellulitis and abscess of leg   Cor pulmonale, chronic   Pulmonary hypertension   Anemia    Diastolic CHF with preserved left ventricular function, NYHA class 2   Obstructive chronic bronchitis without exacerbation   Smoker  Panniculitis/Leukocytosis/Cellulitis - Continued Fevers.  I am more impressed with Legs than Abdomen Continue Zosyn/Vanco.  I do not see the need to check Ab/Pevic CT.  Does not need Surgery consultation. PNA - Continue Zosyn/Vanco. Soft Call AFIb RVR - Cardizem/Dig/lovenox.  She has refused Coumadin several in the past as she was on it for a little while and come off on her own.  She has also refused the newer agents.  CHF - Diastolic/R Sided c recurring Anasarca although recent Ab [place] in March 2015 showing Hepatomegaly with suspected hepatic steatosis/Prior cholecystectomy/No ascites is visualized  -- she recently got into trouble c 50# weight gain quickly and has been in process of diuresing it off.  Will take this opportunity to update ECHO but will check Office records first.  Last 2-D echocardiogram I have access to now was 04/2010 - which showed normal EF, significant pulmonary hypertension, pulmonary vein enlargement, and left ventricular hypertrophy. Continue Lasix/Aldactone Severe Pulmonary HTN/Cor Pulmonale from Obesity Hypoventilation and COPD Morbid obesity - Due to chronic illness she also has Prot Cal Malnutrition - Low Albumin - and needs wt loss but to be able to maintain muscle mass. Consult nutrition COPD/Smoker - Needs to quit - Provide inhalers prn Hypertension - BP fine Anemia - Hbg stable 10.2 - follow Hyperlipidemia.  Chronic Low back pain/Degenerative disk disease/Gait Instability/Fall Risk/Chronic OA - Walker.  DM2 Neuropathy. - Gabapentin Chronic Venous insufficiency with chronic venous stasis c lower extremity edema.  History recurrent cellulitis/extensive groin abscess.  Chronic pain.  Chronic urticaria.  Anxiety.  Type II or unspecified type diabetes mellitus with neurological manifestations, uncontrolled- titrate insulin -  Lantus/Novolog-   Recent Labs  11/05/13 0011  GLUCAP 236*   ID -  Anti-infectives   Start     Dose/Rate Route Frequency Ordered Stop   11/05/13 0600  vancomycin (VANCOCIN) IVPB 1000 mg/200 mL premix     1,000 mg 200 mL/hr over 60 Minutes Intravenous Every 8 hours 11/05/13 0053     11/05/13 0200  piperacillin-tazobactam (ZOSYN) IVPB 3.375 g     3.375 g 12.5 mL/hr over 240 Minutes Intravenous Every 8 hours 11/05/13 0006     11/05/13 0002  piperacillin-tazobactam (ZOSYN) IVPB 3.375 g  Status:  Discontinued     3.375 g 100 mL/hr over 30 Minutes Intravenous 3 times per day 11/05/13 0002 11/05/13 0005   11/04/13 2115  vancomycin (VANCOCIN) IVPB 1000 mg/200 mL premix     1,000 mg 200 mL/hr over 60 Minutes Intravenous  Once 11/04/13 2109 11/04/13 2312  DVT Prophylaxis DNR    LOS: 1 day   Naia Ruff M 11/05/2013, 7:52 AM

## 2013-11-05 NOTE — Progress Notes (Signed)
  RD consulted for nutrition education regarding weight loss due to morbid obesity. Per MD pt "needs fat loss but increased protein stores"  Body mass index is 56.96 kg/(m^2). Pt meets criteria for Morbid Obesity based on current BMI. Pt asleep at time of visit. Left note for pt to eat a general healthful diet with high protein foods to promote both healing and weight loss.  RD provided "General Healthful Diet", "High Protein Foods List", and "Weight Loss Tips" handouts from the Academy of [employer]; left handouts at bedside. Also provided a variety of sample menus.    Current diet order is Carb Modified. RD contact information provided. RD will attempt to meet with patient tomorrow to provide complete diet education when pt is awake/alert.   Ian Malkin RD, LDN Inpatient Clinical Dietitian Pager: [phone] After Hours Pager: [phone]

## 2013-11-06 ENCOUNTER — Encounter (HOSPITAL_COMMUNITY): Payer: Self-pay | Admitting: General Practice

## 2013-11-06 DIAGNOSIS — L03119 Cellulitis of unspecified part of limb: Secondary | ICD-10-CM | POA: Diagnosis not present

## 2013-11-06 DIAGNOSIS — L02419 Cutaneous abscess of limb, unspecified: Secondary | ICD-10-CM | POA: Diagnosis not present

## 2013-11-06 LAB — GLUCOSE, CAPILLARY
GLUCOSE-CAPILLARY: 237 mg/dL — AB (ref 70–99)
GLUCOSE-CAPILLARY: 215 mg/dL — AB (ref 70–99)
GLUCOSE-CAPILLARY: 176 mg/dL — AB (ref 70–99)
GLUCOSE-CAPILLARY: 174 mg/dL — AB (ref 70–99)
Glucose-Capillary: 209 mg/dL — ABNORMAL HIGH (ref 70–99)
Glucose-Capillary: 179 mg/dL — ABNORMAL HIGH (ref 70–99)
Glucose-Capillary: 205 mg/dL — ABNORMAL HIGH (ref 70–99)

## 2013-11-06 LAB — CBC
HEMATOCRIT: 33.3 % — AB (ref 36.0–46.0)
HEMOGLOBIN: 10.4 g/dL — AB (ref 12.0–15.0)
MCH: 25.6 pg — AB (ref 26.0–34.0)
MCHC: 31.2 g/dL (ref 30.0–36.0)
MCV: 81.8 fL (ref 78.0–100.0)
Platelets: 291 10*3/uL (ref 150–400)
RBC: 4.07 MIL/uL (ref 3.87–5.11)
RDW: 16.9 % — ABNORMAL HIGH (ref 11.5–15.5)
WBC: 8.8 10*3/uL (ref 4.0–10.5)

## 2013-11-06 LAB — BASIC METABOLIC PANEL
Anion gap: 15 (ref 5–15)
BUN: 23 mg/dL (ref 6–23)
CHLORIDE: 95 meq/L — AB (ref 96–112)
CO2: 24 meq/L (ref 19–32)
Calcium: 9.1 mg/dL (ref 8.4–10.5)
Creatinine, Ser: 1.03 mg/dL (ref 0.50–1.10)
GFR calc Af Amer: 65 mL/min — ABNORMAL LOW (ref >90)
GFR calc non Af Amer: 56 mL/min — ABNORMAL LOW (ref >90)
Glucose, Bld: 171 mg/dL — ABNORMAL HIGH (ref 70–99)
POTASSIUM: 4.2 meq/L (ref 3.7–5.3)
Sodium: 134 mEq/L — ABNORMAL LOW (ref 137–147)

## 2013-11-06 MED ORDER — HYDROCODONE-ACETAMINOPHEN 5-325 MG PO TABS
1.0000 | ORAL_TABLET | Freq: Four times a day (QID) | ORAL | Status: DC | PRN
  Administered 2013-11-06 – 2013-11-07 (×4): 1 via ORAL
  Administered 2013-11-08: 2 via ORAL
  Filled 2013-11-06: qty 2
  Filled 2013-11-06 (×4): qty 1

## 2013-11-06 MED ORDER — PNEUMOCOCCAL VAC POLYVALENT 25 MCG/0.5ML IJ INJ
0.5000 mL | INJECTION | INTRAMUSCULAR | Status: AC
  Administered 2013-11-07: 0.5 mL via INTRAMUSCULAR
  Filled 2013-11-06: qty 0.5

## 2013-11-06 NOTE — Progress Notes (Signed)
Subjective: Doing OK today. Right lower abd is less tense. No f/c/s Eating "I don't eat" Bowels OK No pain Breathing OK   Objective: Vital signs in last 24 hours: Temp:  [97.3 F (36.3 C)-97.8 F (36.6 C)] 97.3 F (36.3 C) (09/24 0543) Pulse Rate:  [71-105] 71 (09/24 0543) Resp:  [20-22] 22 (09/24 0543) BP: (102-107)/(61-76) 105/61 mmHg (09/24 0543) SpO2:  [95 %-97 %] 97 % (09/24 0543) Weight:  [156.083 kg (344 lb 1.6 oz)] 156.083 kg (344 lb 1.6 oz) (09/24 0543)  Intake/Output from previous day: 09/23 0701 - 09/24 0700 In: 720 [P.O.:720] Out: 975 [Urine:975] Intake/Output this shift: Total I/O In: 240 [P.O.:240] Out: 75 [Urine:75]  Morbidly obese, face symmetric, dentition poor. Lungs reduced. Ht IR IR pannus is less firm and less red. Legs are less red with 4+ edema. Awake, mentating OK  Lab Results   Recent Labs  11/05/13 0520 11/06/13 0620  WBC 11.5* 8.8  RBC 4.00 4.07  HGB 10.2* 10.4*  HCT 32.2* 33.3*  MCV 80.5 81.8  MCH 25.5* 25.6*  RDW 16.8* 16.9*  PLT 297 291    Recent Labs  11/05/13 0520 11/06/13 0620  NA 134* 134*  K 3.6* 4.2  CL 95* 95*  CO2 25 24  GLUCOSE 192* 171*  BUN 20 23  CREATININE 1.06 1.03  CALCIUM 8.8 9.1    Studies/Results: Dg Chest 2 View  11/04/2013   CLINICAL DATA:  Generalized weakness.  Initial encounter  EXAM: CHEST  2 VIEW  COMPARISON:  04/18/2010  FINDINGS: Chronic cardiopericardial enlargement. Aortic contours are unchanged.  There is cephalized blood flow and diffuse interstitial coarsening. Opacification is most dense at the right base. No significant effusion. No pneumothorax.  IMPRESSION: 1. Cardiomegaly and mild pulmonary edema. 2. Asymmetric opacity at the right base; cannot exclude pneumonia at this location.   Electronically Signed   By: Tiburcio Pea M.D.   On: 11/04/2013 20:55    Scheduled Meds: . ALPRAZolam  1 mg Oral BID  . digoxin  0.125 mg Oral Daily  . diltiazem  240 mg Oral Daily  . enoxaparin  (LOVENOX) injection  40 mg Subcutaneous Q12H  . furosemide  40 mg Intravenous BID  . gabapentin  200 mg Oral BID  . hydrocerin   Topical Daily  . insulin aspart  0-15 Units Subcutaneous TID WC  . insulin aspart  0-5 Units Subcutaneous QHS  . insulin aspart  4 Units Subcutaneous TID WC  . insulin glargine  15 Units Subcutaneous QHS  . mupirocin cream   Topical BID  . piperacillin-tazobactam (ZOSYN)  IV  3.375 g Intravenous Q8H  . spironolactone  25 mg Oral Daily  . vancomycin  1,000 mg Intravenous Q8H   Continuous Infusions: . sodium chloride 50 mL/hr at 11/05/13 0018   PRN Meds:acetaminophen, albuterol, HYDROcodone-acetaminophen, traMADol  Assessment/Plan:  Panniculitis/Leukocytosis/Cellulitis - Fever curve and WBC look better. Contineu dual abx for now PNA - Continue Zosyn/Vanco. Soft Call  AFIb RVR - Cardizem/Dig/lovenox. She has refused Coumadin several in the past as she was on it for a little while and come off on her own. She has also refused the newer agents. On lovenox for DVT proph only CHF - Diastolic/R Sided c recurring Anasarca EF still fine PA pressure 45, mild PR Severe Pulmonary HTN/Cor Pulmonale from Obesity Hypoventilation and COPD  Morbid obesity - Due to chronic illness she also has Prot Cal Malnutrition - Low Albumin - and needs wt loss but to be able to maintain  muscle mass. Consult nutrition  COPD/Smoker - Needs to quit - Provide inhalers prn  Hypertension - BP fine still Anemia - Hbg stable 10.2 - iron levels low, refuses eval Hyperlipidemia.  Chronic Low back pain/Degenerative disk disease/Gait Instability/Fall Risk/Chronic OA - seen by PT but they already signed off  DM2 Neuropathy. - Gabapentin  Chronic Venous insufficiency with chronic venous stasis c lower extremity edema.  History recurrent cellulitis/extensive groin abscess. No more issues of this magnitude Chronic pain. Under control Chronic urticaria.  Anxiety    LOS: 2 days   Joy Benitez  Joy Benitez 11/06/2013, 1:01 PM

## 2013-11-06 NOTE — Evaluation (Signed)
Physical Therapy Evaluation Patient Details Name: Joy Benitez MRN: [id number] DOB: Nov 22, 1945 Today's Date: 11/06/2013   History of Present Illness  Adm 11/04/13 with acute panniculitis, bil LE edema, and ?pneumonia. PMHx-DM, neuropathy, CHF, anxiety, afib  Clinical Impression  Patient evaluated by Physical Therapy with no further acute PT needs identified. Pt reports she is at her baseline for mobility (and actually walked farther than she does at home!). SPT is signing off. Thank you for this referral.     Follow Up Recommendations No PT follow up;Supervision - Intermittent    Equipment Recommendations  None recommended by PT    Recommendations for Other Services       Precautions / Restrictions Precautions Precautions: Fall Precaution Comments: reports "bad knees" and sometimes feel very weak or painful      Mobility  Bed Mobility Overal bed mobility: Needs Assistance Bed Mobility: Supine to Sit     Supine to sit: HOB elevated;Min assist     General bed mobility comments: pt uses momentum once her legs are over EOB to "rock" her torso up to sitting; required the slightest assist via pulling with her RUE   Transfers Overall transfer level: Modified independent Equipment used: Rolling walker (2 wheeled)             General transfer comment: pt comes up somewhat diagonally over her LLE due to body habitus  Ambulation/Gait Ambulation/Gait assistance: Supervision Ambulation Distance (Feet): 80 Feet Assistive device: Rolling walker (2 wheeled) Gait Pattern/deviations: Step-to pattern;Shuffle;Decreased stride length;Trunk flexed Gait velocity: decr   General Gait Details: reports she is at her baseline; supervision for safety and IV  Stairs            Wheelchair Mobility    Modified Rankin (Stroke Patients Only)       Balance                                             Pertinent Vitals/Pain Pain Assessment: 0-10 Pain  Score: 6  Pain Location: bil knees Pain Intervention(s): Limited activity within patient's tolerance;Monitored during session;Repositioned    Home Living Family/patient expects to be discharged to:: Private residence Living Arrangements: Spouse/significant other;Other relatives Available Help at Discharge: Family;Available 24 hours/day Type of Home: House Home Access: Stairs to enter Entrance Stairs-Rails: Right Entrance Stairs-Number of Steps: 2 Home Layout: Two level;Able to live on main level with bedroom/bathroom (washroom down 3 steps) Home Equipment: Grab bars - tub/shower;Walker - 2 wheels (walking stick)      Prior Function Level of Independence: Needs assistance   Gait / Transfers Assistance Needed: uses walker inside and outside  ADL's / Homemaking Assistance Needed: no longer gets in shower (needed assist); assist with all housekeeping  Comments: uses walker inside and outside     Hand Dominance        Extremity/Trunk Assessment   Upper Extremity Assessment: Overall WFL for tasks assessed           Lower Extremity Assessment: Overall WFL for tasks assessed      Cervical / Trunk Assessment: Other exceptions  Communication   Communication: No difficulties  Cognition Arousal/Alertness: Awake/alert Behavior During Therapy: WFL for tasks assessed/performed Overall Cognitive Status: Within Functional Limits for tasks assessed                      General Comments  Exercises        Assessment/Plan    PT Assessment Patent does not need any further PT services  PT Diagnosis     PT Problem List    PT Treatment Interventions     PT Goals (Current goals can be found in the Care Plan section) Acute Rehab PT Goals PT Goal Formulation: No goals set, d/c therapy    Frequency     Barriers to discharge        Co-evaluation               End of Session   Activity Tolerance: Patient tolerated treatment well Patient left: in  chair;with chair alarm set;with nursing/sitter in room (nursing giving meds & to give pt call bell) Nurse Communication: Mobility status;Other (comment) (d/c from PT)         Time: 0254-2706 PT Time Calculation (min): 23 min   Charges:   PT Evaluation $Initial PT Evaluation Tier I: 1 Procedure PT Treatments $Gait Training: 8-22 mins   PT G Codes:          Julienne Vogler 2013-12-05, 11:45 AM Pager [phone]

## 2013-11-07 DIAGNOSIS — L03119 Cellulitis of unspecified part of limb: Secondary | ICD-10-CM | POA: Diagnosis not present

## 2013-11-07 DIAGNOSIS — L02419 Cutaneous abscess of limb, unspecified: Secondary | ICD-10-CM | POA: Diagnosis not present

## 2013-11-07 LAB — GLUCOSE, CAPILLARY
GLUCOSE-CAPILLARY: 173 mg/dL — AB (ref 70–99)
GLUCOSE-CAPILLARY: 212 mg/dL — AB (ref 70–99)
Glucose-Capillary: 187 mg/dL — ABNORMAL HIGH (ref 70–99)
Glucose-Capillary: 215 mg/dL — ABNORMAL HIGH (ref 70–99)

## 2013-11-07 LAB — CBC
HEMATOCRIT: 31.7 % — AB (ref 36.0–46.0)
Hemoglobin: 9.8 g/dL — ABNORMAL LOW (ref 12.0–15.0)
MCH: 25.2 pg — AB (ref 26.0–34.0)
MCHC: 30.9 g/dL (ref 30.0–36.0)
MCV: 81.5 fL (ref 78.0–100.0)
Platelets: 310 10*3/uL (ref 150–400)
RBC: 3.89 MIL/uL (ref 3.87–5.11)
RDW: 17.2 % — ABNORMAL HIGH (ref 11.5–15.5)
WBC: 8.5 10*3/uL (ref 4.0–10.5)

## 2013-11-07 LAB — URINE CULTURE

## 2013-11-07 LAB — BASIC METABOLIC PANEL
Anion gap: 18 — ABNORMAL HIGH (ref 5–15)
BUN: 24 mg/dL — AB (ref 6–23)
CO2: 22 mEq/L (ref 19–32)
CREATININE: 1.02 mg/dL (ref 0.50–1.10)
Calcium: 8.8 mg/dL (ref 8.4–10.5)
Chloride: 96 mEq/L (ref 96–112)
GFR calc non Af Amer: 56 mL/min — ABNORMAL LOW (ref >90)
GFR, EST AFRICAN AMERICAN: 65 mL/min — AB (ref >90)
Glucose, Bld: 192 mg/dL — ABNORMAL HIGH (ref 70–99)
Potassium: 4 mEq/L (ref 3.7–5.3)
Sodium: 136 mEq/L — ABNORMAL LOW (ref 137–147)

## 2013-11-07 MED ORDER — POLYETHYLENE GLYCOL 3350 17 G PO PACK
17.0000 g | PACK | Freq: Every day | ORAL | Status: DC
  Administered 2013-11-07 – 2013-11-11 (×5): 17 g via ORAL
  Filled 2013-11-07 (×6): qty 1

## 2013-11-07 NOTE — Progress Notes (Signed)
Paged on call Physician  Dr. Evlyn Kanner for patient. She is complaining of cracking in her left leg when she stands and tries to walk. Pt's. Son stated that patient normally walks better than what she is doing, and that something must be going on with her left leg.

## 2013-11-07 NOTE — Progress Notes (Signed)
Subjective: Slept fair. No pain. Some constipation Family concerned about depression, discussed with pt. Wishes to talk with Dr Timothy Lasso at her [facility] visit  Objective: Vital signs in last 24 hours: Temp:  [97.5 F (36.4 C)-97.6 F (36.4 C)] 97.5 F (36.4 C) (09/25 0436) Pulse Rate:  [77-86] 78 (09/25 0436) Resp:  [20-22] 20 (09/25 0436) BP: (98-128)/(60-68) 128/65 mmHg (09/25 0436) SpO2:  [90 %-100 %] 100 % (09/25 0436) Weight:  [154.84 kg (341 lb 5.8 oz)] 154.84 kg (341 lb 5.8 oz) (09/25 0436)  Intake/Output from previous day: 09/24 0701 - 09/25 0700 In: 860 [P.O.:480; I.V.:80; IV Piggyback:300] Out: 1300 [Urine:1300] Intake/Output this shift:    Sitting up ,no distress. Lungs still a bit dull at the bases. Ht IR IR abd obese. Pannus is less firm and less red. Legs less red but with several excoriation and superficial wounds, 2+ edema, awake. Clear speech  Lab Results   Recent Labs  11/06/13 0620 11/07/13 0415  WBC 8.8 8.5  RBC 4.07 3.89  HGB 10.4* 9.8*  HCT 33.3* 31.7*  MCV 81.8 81.5  MCH 25.6* 25.2*  RDW 16.9* 17.2*  PLT 291 310    Recent Labs  11/06/13 0620 11/07/13 0415  NA 134* 136*  K 4.2 4.0  CL 95* 96  CO2 24 22  GLUCOSE 171* 192*  BUN 23 24*  CREATININE 1.03 1.02  CALCIUM 9.1 8.8    Studies/Results: No results found.  Scheduled Meds: . ALPRAZolam  1 mg Oral BID  . digoxin  0.125 mg Oral Daily  . diltiazem  240 mg Oral Daily  . enoxaparin (LOVENOX) injection  40 mg Subcutaneous Q12H  . furosemide  40 mg Intravenous BID  . gabapentin  200 mg Oral BID  . hydrocerin   Topical Daily  . insulin aspart  0-15 Units Subcutaneous TID WC  . insulin aspart  0-5 Units Subcutaneous QHS  . insulin aspart  4 Units Subcutaneous TID WC  . insulin glargine  15 Units Subcutaneous QHS  . mupirocin cream   Topical BID  . piperacillin-tazobactam (ZOSYN)  IV  3.375 g Intravenous Q8H  . pneumococcal 23 valent vaccine  0.5 mL Intramuscular Tomorrow-1000  .  polyethylene glycol  17 g Oral Daily  . spironolactone  25 mg Oral Daily  . vancomycin  1,000 mg Intravenous Q8H   Continuous Infusions: . sodium chloride 50 mL/hr at 11/06/13 1750   PRN Meds:acetaminophen, albuterol, HYDROcodone-acetaminophen, traMADol  Assessment/Plan:  Panniculitis/Leukocytosis/Cellulitis - stop vanco, consider d/c on augmentin tomorrow if stable  PNA -no pulms xsx  AFIb RVR - Cardizem/Dig/lovenox. She has refused Coumadin several in the past as she was on it for a little while and come off on her own. She has also refused the newer agents. On lovenox for DVT proph only  CHF - Diastolic/R Sided c recurring Anasarca EF still fine PA pressure 45, mild PR  Severe Pulmonary HTN/Cor Pulmonale from Obesity Hypoventilation and COPD  Morbid obesity - Due to chronic illness she also has  Prot Cal Malnutrition -no data COPD/Smoker - Needs to quit - Provide inhalers prn  Hypertension - BP fine still  Anemia - Hbg stable 10.2 - iron levels low, refuses eval  Hyperlipidemia.  Chronic Low back pain/Degenerative disk disease/Gait Instability/Fall Risk/Chronic OA - seen by PT but they already signed off  DM2 Neuropathy. - Gabapentin  Chronic Venous insufficiency with chronic venous stasis c lower extremity edema.  History recurrent cellulitis/extensive groin abscess. No more issues of this magnitude  Chronic pain. Under control  Chronic urticaria.  Anxiety    LOS: 3 days   Joy Benitez [name] 11/07/2013, 8:44 AM

## 2013-11-07 NOTE — Progress Notes (Signed)
Pt refused to get in bed to go to Xray, pt refused for RN to use lift equipment to move to bed. Transporter unable to take pt in recliner. Xray tech notified and stated pt can have portable xray in the recliner. Pt verbalized understanding. Chair alarm placed under patient. Pt continues to attempt to get out of recliner alone. Pt educated on importance of staying in recliner as she states that she cannot stand at this time due to leg pain. Pt on chair alarm with call bell within reach.

## 2013-11-08 ENCOUNTER — Other Ambulatory Visit (HOSPITAL_COMMUNITY): Payer: Medicare Other

## 2013-11-08 ENCOUNTER — Inpatient Hospital Stay (HOSPITAL_COMMUNITY): Payer: Medicare Other

## 2013-11-08 LAB — CBC
HCT: 32.3 % — ABNORMAL LOW (ref 36.0–46.0)
Hemoglobin: 9.9 g/dL — ABNORMAL LOW (ref 12.0–15.0)
MCH: 25.9 pg — ABNORMAL LOW (ref 26.0–34.0)
MCHC: 30.7 g/dL (ref 30.0–36.0)
MCV: 84.6 fL (ref 78.0–100.0)
PLATELETS: 301 10*3/uL (ref 150–400)
RBC: 3.82 MIL/uL — ABNORMAL LOW (ref 3.87–5.11)
RDW: 17.5 % — AB (ref 11.5–15.5)
WBC: 8.2 10*3/uL (ref 4.0–10.5)

## 2013-11-08 LAB — BASIC METABOLIC PANEL
ANION GAP: 18 — AB (ref 5–15)
BUN: 24 mg/dL — ABNORMAL HIGH (ref 6–23)
CO2: 23 mEq/L (ref 19–32)
Calcium: 8.8 mg/dL (ref 8.4–10.5)
Chloride: 95 mEq/L — ABNORMAL LOW (ref 96–112)
Creatinine, Ser: 1.08 mg/dL (ref 0.50–1.10)
GFR calc Af Amer: 61 mL/min — ABNORMAL LOW (ref >90)
GFR, EST NON AFRICAN AMERICAN: 53 mL/min — AB (ref >90)
Glucose, Bld: 178 mg/dL — ABNORMAL HIGH (ref 70–99)
Potassium: 3.7 mEq/L (ref 3.7–5.3)
SODIUM: 136 meq/L — AB (ref 137–147)

## 2013-11-08 LAB — GLUCOSE, CAPILLARY
GLUCOSE-CAPILLARY: 180 mg/dL — AB (ref 70–99)
GLUCOSE-CAPILLARY: 215 mg/dL — AB (ref 70–99)
GLUCOSE-CAPILLARY: 170 mg/dL — AB (ref 70–99)
Glucose-Capillary: 202 mg/dL — ABNORMAL HIGH (ref 70–99)

## 2013-11-08 MED ORDER — INSULIN GLARGINE 100 UNIT/ML ~~LOC~~ SOLN
18.0000 [IU] | Freq: Every day | SUBCUTANEOUS | Status: DC
  Administered 2013-11-08 – 2013-11-11 (×4): 18 [IU] via SUBCUTANEOUS
  Filled 2013-11-08 (×6): qty 0.18

## 2013-11-08 NOTE — Progress Notes (Signed)
Subjective: Was up and doing OK earlier. Now back in bed and complaining of left leg and knee pain. No injury. Unclear if this was a prior issue. XR done portably was OK BS up some. No fever on monotherapy abx   Objective: Vital signs in last 24 hours: Temp:  [97.5 F (36.4 C)-98 F (36.7 C)] 97.5 F (36.4 C) (09/26 0658) Pulse Rate:  [78-92] 78 (09/26 0658) Resp:  [20] 20 (09/26 0658) BP: (99-114)/(55-70) 114/70 mmHg (09/26 0658) SpO2:  [91 %-93 %] 91 % (09/26 0658) Weight:  [157.7 kg (347 lb 10.7 oz)] 157.7 kg (347 lb 10.7 oz) (09/26 0658)  Intake/Output from previous day: 09/25 0701 - 09/26 0700 In: 960 [P.O.:960] Out: 675 [Urine:675] Intake/Output this shift:    Lying in air bed. Some cough. Some wheeze on exam  Pannus still a little red. Excoriations on legs look stable. Knee itself is not red. Unclear if it tender. Less coherent (? Real)  Lab Results   Recent Labs  11/07/13 0415 11/08/13 0456  WBC 8.5 8.2  RBC 3.89 3.82*  HGB 9.8* 9.9*  HCT 31.7* 32.3*  MCV 81.5 84.6  MCH 25.2* 25.9*  RDW 17.2* 17.5*  PLT 310 301    Recent Labs  11/07/13 0415 11/08/13 0456  NA 136* 136*  K 4.0 3.7  CL 96 95*  CO2 22 23  GLUCOSE 192* 178*  BUN 24* 24*  CREATININE 1.02 1.08  CALCIUM 8.8 8.8    Studies/Results: Dg Tibia/fibula Left  11/08/2013   CLINICAL DATA:  Left lower leg pain and swelling.  EXAM: LEFT TIBIA AND FIBULA - 2 VIEW  COMPARISON:  None.  FINDINGS: Increased density in the subcutaneous fat of the left lower leg likely due to edema. No radiopaque soft tissue foreign bodies or gas collections. Tibia and fibula appear intact. No acute fractures or subluxations. No focal bone lesion or bone destruction. Degenerative changes in the knee and ankle.  IMPRESSION: Soft tissue edema. No radiopaque foreign bodies or gas collections. No acute bony abnormalities tree   Electronically Signed   By: Burman Nieves M.D.   On: 11/08/2013 00:33    Scheduled Meds: .  ALPRAZolam  1 mg Oral BID  . digoxin  0.125 mg Oral Daily  . diltiazem  240 mg Oral Daily  . enoxaparin (LOVENOX) injection  40 mg Subcutaneous Q12H  . furosemide  40 mg Intravenous BID  . gabapentin  200 mg Oral BID  . hydrocerin   Topical Daily  . insulin aspart  0-15 Units Subcutaneous TID WC  . insulin aspart  0-5 Units Subcutaneous QHS  . insulin aspart  4 Units Subcutaneous TID WC  . insulin glargine  15 Units Subcutaneous QHS  . mupirocin cream   Topical BID  . piperacillin-tazobactam (ZOSYN)  IV  3.375 g Intravenous Q8H  . polyethylene glycol  17 g Oral Daily  . spironolactone  25 mg Oral Daily   Continuous Infusions: . sodium chloride 50 mL/hr at 11/06/13 1750   PRN Meds:acetaminophen, albuterol, HYDROcodone-acetaminophen, traMADol  Assessment/Plan:  Panniculitis/Leukocytosis/Cellulitis - doing well. On Rx. Continue IV  PNA -some cough today AFIb RVR - Cardizem/Dig/lovenox. She has refused Coumadin several in the past as she was on it for a little while and come off on her own. She has also refused the newer agents. On lovenox for DVT proph only  CHF - Diastolic/R Sided c recurring Anasarca EF still fine PA pressure 45, mild PR  Severe Pulmonary HTN/Cor Pulmonale from  Obesity Hypoventilation and COPD  Morbid obesity - Due to chronic illness she also has  Prot Cal Malnutrition -no data  COPD/Smoker - Needs to quit - Provide inhalers prn  Hypertension - BP fine still  Anemia - Hbg stable 10.2 - iron levels low, refuses eval  Hyperlipidemia.  Chronic Low back pain/Degenerative disk disease/Gait Instability/Fall Risk/Chronic OA - seen by PT but they already signed off  DM2 /Neuropathy. - Gabapentin , increase Rx Chronic Venous insufficiency with chronic venous stasis c lower extremity edema.  History recurrent cellulitis/extensive groin abscess. No more issues of this magnitude  Chronic pain. Under control  Chronic urticaria.  Anxiety LEG PAIN: a new issue. XR fine. Watch  for now. Likely weight related.     LOS: 4 days   Dajon Lazar [name] 11/08/2013, 10:12 AM

## 2013-11-09 ENCOUNTER — Inpatient Hospital Stay (HOSPITAL_COMMUNITY): Payer: Medicare Other

## 2013-11-09 LAB — CBC
HCT: 32.7 % — ABNORMAL LOW (ref 36.0–46.0)
Hemoglobin: 10 g/dL — ABNORMAL LOW (ref 12.0–15.0)
MCH: 25.3 pg — AB (ref 26.0–34.0)
MCHC: 30.6 g/dL (ref 30.0–36.0)
MCV: 82.6 fL (ref 78.0–100.0)
PLATELETS: 318 10*3/uL (ref 150–400)
RBC: 3.96 MIL/uL (ref 3.87–5.11)
RDW: 17.5 % — ABNORMAL HIGH (ref 11.5–15.5)
WBC: 8.6 10*3/uL (ref 4.0–10.5)

## 2013-11-09 LAB — COMPREHENSIVE METABOLIC PANEL
ALT: 7 U/L (ref 0–35)
AST: 13 U/L (ref 0–37)
Albumin: 2.8 g/dL — ABNORMAL LOW (ref 3.5–5.2)
Alkaline Phosphatase: 91 U/L (ref 39–117)
Anion gap: 15 (ref 5–15)
BUN: 21 mg/dL (ref 6–23)
CALCIUM: 8.9 mg/dL (ref 8.4–10.5)
CO2: 24 mEq/L (ref 19–32)
CREATININE: 1.11 mg/dL — AB (ref 0.50–1.10)
Chloride: 97 mEq/L (ref 96–112)
GFR calc Af Amer: 59 mL/min — ABNORMAL LOW (ref >90)
GFR calc non Af Amer: 51 mL/min — ABNORMAL LOW (ref >90)
Glucose, Bld: 141 mg/dL — ABNORMAL HIGH (ref 70–99)
Potassium: 3.9 mEq/L (ref 3.7–5.3)
Sodium: 136 mEq/L — ABNORMAL LOW (ref 137–147)
TOTAL PROTEIN: 7.2 g/dL (ref 6.0–8.3)
Total Bilirubin: 2 mg/dL — ABNORMAL HIGH (ref 0.3–1.2)

## 2013-11-09 LAB — GLUCOSE, CAPILLARY
GLUCOSE-CAPILLARY: 136 mg/dL — AB (ref 70–99)
Glucose-Capillary: 139 mg/dL — ABNORMAL HIGH (ref 70–99)
Glucose-Capillary: 154 mg/dL — ABNORMAL HIGH (ref 70–99)
Glucose-Capillary: 169 mg/dL — ABNORMAL HIGH (ref 70–99)

## 2013-11-09 MED ORDER — DOXYCYCLINE HYCLATE 100 MG PO TABS
100.0000 mg | ORAL_TABLET | Freq: Two times a day (BID) | ORAL | Status: DC
  Administered 2013-11-10 – 2013-11-12 (×5): 100 mg via ORAL
  Filled 2013-11-09 (×6): qty 1

## 2013-11-09 MED ORDER — FUROSEMIDE 20 MG PO TABS
20.0000 mg | ORAL_TABLET | Freq: Every day | ORAL | Status: DC
  Administered 2013-11-10 – 2013-11-11 (×2): 20 mg via ORAL
  Filled 2013-11-09 (×3): qty 1

## 2013-11-09 MED ORDER — ALPRAZOLAM 0.5 MG PO TABS: 0.5000 mg | ORAL_TABLET | Freq: Two times a day (BID) | ORAL | Status: DC | PRN

## 2013-11-09 MED ORDER — HYDROCODONE-ACETAMINOPHEN 5-325 MG PO TABS: 1.0000 | ORAL_TABLET | Freq: Two times a day (BID) | ORAL | Status: DC | PRN

## 2013-11-09 NOTE — Progress Notes (Signed)
Subjective: "threw a fit" and pulled out IV and refused meds. Demanded to get up and walk (it is not sure whether she walks at home). Relented and too meds after a while  No more leg pain after I left yesterday.  Will awaken and be coherent at times. Doesn't seem to want to wake up for me today Wounds reviewed with nurse during dressing change   Objective: Vital signs in last 24 hours: Temp:  [97.7 F (36.5 C)-98.2 F (36.8 C)] 98.2 F (36.8 C) (09/27 0453) Pulse Rate:  [71-84] 76 (09/27 0453) Resp:  [20] 20 (09/27 0453) BP: (109-122)/(42-63) 111/51 mmHg (09/27 0453) SpO2:  [91 %-92 %] 92 % (09/27 0453) Weight:  [165.563 kg (365 lb)] 165.563 kg (365 lb) (09/27 0811)  Intake/Output from previous day: 09/26 0701 - 09/27 0700 In: 460 [P.O.:460] Out: -  Intake/Output this shift: Total I/O In: 50 [P.O.:50] Out: -   Obese. No distress.on air bed. Lungs fairly clear, no accessory ms in use. Ht IR IR abd pannus is less red and less firm. LE edema is a lot better and lesions are healing. Neuro: as above (? Some factitious behavior)  Lab Results   Recent Labs  11/08/13 0456 11/09/13 0404  WBC 8.2 8.6  RBC 3.82* 3.96  HGB 9.9* 10.0*  HCT 32.3* 32.7*  MCV 84.6 82.6  MCH 25.9* 25.3*  RDW 17.5* 17.5*  PLT 301 318    Recent Labs  11/08/13 0456 11/09/13 0404  NA 136* 136*  K 3.7 3.9  CL 95* 97  CO2 23 24  GLUCOSE 178* 141*  BUN 24* 21  CREATININE 1.08 1.11*  CALCIUM 8.8 8.9    Studies/Results: Dg Tibia/fibula Left  11/08/2013   CLINICAL DATA:  Left lower leg pain and swelling.  EXAM: LEFT TIBIA AND FIBULA - 2 VIEW  COMPARISON:  None.  FINDINGS: Increased density in the subcutaneous fat of the left lower leg likely due to edema. No radiopaque soft tissue foreign bodies or gas collections. Tibia and fibula appear intact. No acute fractures or subluxations. No focal bone lesion or bone destruction. Degenerative changes in the knee and ankle.  IMPRESSION: Soft tissue edema.  No radiopaque foreign bodies or gas collections. No acute bony abnormalities tree   Electronically Signed   By: Burman Nieves M.D.   On: 11/08/2013 00:33    Scheduled Meds: . ALPRAZolam  1 mg Oral BID  . digoxin  0.125 mg Oral Daily  . diltiazem  240 mg Oral Daily  . enoxaparin (LOVENOX) injection  40 mg Subcutaneous Q12H  . furosemide  40 mg Intravenous BID  . gabapentin  200 mg Oral BID  . hydrocerin   Topical Daily  . insulin aspart  0-15 Units Subcutaneous TID WC  . insulin aspart  0-5 Units Subcutaneous QHS  . insulin aspart  4 Units Subcutaneous TID WC  . insulin glargine  18 Units Subcutaneous QHS  . mupirocin cream   Topical BID  . piperacillin-tazobactam (ZOSYN)  IV  3.375 g Intravenous Q8H  . polyethylene glycol  17 g Oral Daily  . spironolactone  25 mg Oral Daily   Continuous Infusions:  PRN Meds:acetaminophen, albuterol, HYDROcodone-acetaminophen, traMADol  Assessment/Plan:  Panniculitis/Leukocytosis/Cellulitis - doing a lot better, continue monotx  PNA -less cough today , check XR AFIb RVR - Cardizem/Dig/lovenox. She has refused Coumadin several in the past as she was on it for a little while and come off on her own. She has also refused the newer  agents. On lovenox for DVT proph only  CHF - Diastolic/R Sided c recurring Anasarca EF still fine PA pressure 45, mild PR  Fluid status is clearly better.  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  Chronic Low back pain/Degenerative disk disease/Gait Instability/Fall Risk/Chronic OA - seen by PT but they already signed off  DM2 /Neuropathy. - Gabapentin , increase Rx  Chronic Venous insufficiency with chronic venous stasis c lower extremity edema.   History recurrent cellulitis/extensive groin abscess. No more issues of this magnitude  Chronic pain. Under control  Chronic urticaria.  Anxiety  LEG PAIN: less of an issue    LOS: 5 days   Kristian Hazzard [name] 11/09/2013, 11:58 AM

## 2013-11-10 LAB — BASIC METABOLIC PANEL
ANION GAP: 15 (ref 5–15)
BUN: 18 mg/dL (ref 6–23)
CALCIUM: 8.7 mg/dL (ref 8.4–10.5)
CO2: 23 mEq/L (ref 19–32)
CREATININE: 0.99 mg/dL (ref 0.50–1.10)
Chloride: 98 mEq/L (ref 96–112)
GFR calc Af Amer: 68 mL/min — ABNORMAL LOW (ref >90)
GFR, EST NON AFRICAN AMERICAN: 59 mL/min — AB (ref >90)
Glucose, Bld: 161 mg/dL — ABNORMAL HIGH (ref 70–99)
Potassium: 3.9 mEq/L (ref 3.7–5.3)
SODIUM: 136 meq/L — AB (ref 137–147)

## 2013-11-10 LAB — CBC
HEMATOCRIT: 33.9 % — AB (ref 36.0–46.0)
Hemoglobin: 10.5 g/dL — ABNORMAL LOW (ref 12.0–15.0)
MCH: 26.3 pg (ref 26.0–34.0)
MCHC: 31 g/dL (ref 30.0–36.0)
MCV: 84.8 fL (ref 78.0–100.0)
PLATELETS: 303 10*3/uL (ref 150–400)
RBC: 4 MIL/uL (ref 3.87–5.11)
RDW: 17.8 % — AB (ref 11.5–15.5)
WBC: 9.1 10*3/uL (ref 4.0–10.5)

## 2013-11-10 LAB — GLUCOSE, CAPILLARY
Glucose-Capillary: 154 mg/dL — ABNORMAL HIGH (ref 70–99)
Glucose-Capillary: 167 mg/dL — ABNORMAL HIGH (ref 70–99)
Glucose-Capillary: 145 mg/dL — ABNORMAL HIGH (ref 70–99)
Glucose-Capillary: 147 mg/dL — ABNORMAL HIGH (ref 70–99)

## 2013-11-10 LAB — AMMONIA: Ammonia: 61 umol/L — ABNORMAL HIGH (ref 11–60)

## 2013-11-10 MED ORDER — ADULT MULTIVITAMIN W/MINERALS CH
1.0000 | ORAL_TABLET | Freq: Every day | ORAL | Status: DC
  Administered 2013-11-10 – 2013-11-12 (×3): 1 via ORAL
  Filled 2013-11-10 (×3): qty 1

## 2013-11-10 MED ORDER — PRO-STAT SUGAR FREE PO LIQD
30.0000 mL | Freq: Three times a day (TID) | ORAL | Status: DC
  Administered 2013-11-10 – 2013-11-12 (×4): 30 mL via ORAL
  Filled 2013-11-10 (×8): qty 30

## 2013-11-10 MED ORDER — GLUCERNA SHAKE PO LIQD
237.0000 mL | Freq: Two times a day (BID) | ORAL | Status: DC
  Administered 2013-11-12: 237 mL via ORAL

## 2013-11-10 MED ORDER — NICOTINE 7 MG/24HR TD PT24
7.0000 mg | MEDICATED_PATCH | TRANSDERMAL | Status: DC
  Administered 2013-11-10 – 2013-11-11 (×2): 7 mg via TRANSDERMAL
  Filled 2013-11-10 (×3): qty 1

## 2013-11-10 MED ORDER — HALOPERIDOL LACTATE 5 MG/ML IJ SOLN: 2.0000 mg | Freq: Two times a day (BID) | INTRAMUSCULAR | Status: DC | PRN

## 2013-11-10 MED ORDER — GABAPENTIN 100 MG PO CAPS
100.0000 mg | ORAL_CAPSULE | Freq: Three times a day (TID) | ORAL | Status: DC
  Administered 2013-11-10 – 2013-11-12 (×7): 100 mg via ORAL
  Filled 2013-11-10 (×9): qty 1

## 2013-11-10 MED ORDER — HALOPERIDOL 1 MG PO TABS
1.0000 mg | ORAL_TABLET | Freq: Two times a day (BID) | ORAL | Status: DC | PRN
  Filled 2013-11-10: qty 1

## 2013-11-10 NOTE — Evaluation (Signed)
Physical Therapy Re-Evaluation Patient Details Name: Joy Benitez MRN: [id number] DOB: 02/02/1946 Today's Date: 11/10/2013   History of Present Illness  Adm 11/04/13 with acute panniculitis, bil LE edema, and ?pneumonia. PMHx-DM, neuropathy, CHF, anxiety, afib  Clinical Impression  Pt admitted with the above. Was evaluated and d/c by PT on 11/06/13 as pt was at her stated baseline of function. PT again consulted with functional decline over the weekend, reportedly requiring significant assist from nursing staff (+2) to sit EOB. Pt currently with functional limitations due to the deficits listed below (see PT Problem List). At the time of PT re-eval pt was not able to achieve full standing, and recommend lift for OOB at this time for safety. Pt will benefit from skilled PT to increase their independence and safety with mobility to allow discharge to the venue listed below. This is a dramatic functional difference since PT eval, and recommending CIR consult for short term rehab prior to return home, as family states they cannot physically care for her at home if she is requiring +2 max assist for mobility.       Follow Up Recommendations Supervision/Assistance - 24 hour;CIR    Equipment Recommendations  None recommended by PT    Recommendations for Other Services Rehab consult     Precautions / Restrictions Precautions Precautions: Fall Precaution Comments: reports "bad knees" and sometimes feel very weak or painful Restrictions Weight Bearing Restrictions: No      Mobility  Bed Mobility Overal bed mobility: Needs Assistance;+2 for physical assistance Bed Mobility: Supine to Sit;Sit to Supine     Supine to sit: HOB elevated;Mod assist;+2 for physical assistance Sit to supine: Max assist;+2 for physical assistance   General bed mobility comments: Bed pad used to assist in scooting hips around. +2 assist for trunk elevation and to position LE's off EOB.   Transfers Overall  transfer level: Needs assistance Equipment used: Rolling walker (2 wheeled) Transfers: Sit to/from Stand Sit to Stand: Max assist;+2 physical assistance;+2 safety/equipment         General transfer comment: Pt was cued for hand placement on seated surface for safety. Bed was elevated and max +2 was provided, however pt was not able to achieve full standing at EOB. Transfer was attempted x3. Pt had difficulty extending hips and trunk, and was bent over onto the walker.   Ambulation/Gait             General Gait Details: SPT to recliner as well as any further ambulation was deferred due to safety. Pt eager to sit up in chair, however feel the lift is more appropriate at this time.   Stairs            Wheelchair Mobility    Modified Rankin (Stroke Patients Only)       Balance Overall balance assessment: Needs assistance Sitting-balance support: Feet supported;Bilateral upper extremity supported Sitting balance-Leahy Scale: Poor   Postural control: Posterior lean Standing balance support: Bilateral upper extremity supported;During functional activity Standing balance-Leahy Scale: Zero                               Pertinent Vitals/Pain Pain Assessment: No/denies pain    Home Living Family/patient expects to be discharged to:: Skilled nursing facility Living Arrangements: Spouse/significant other;Other relatives Available Help at Discharge: Family;Available 24 hours/day Type of Home: House Home Access: Stairs to enter Entrance Stairs-Rails: Right Entrance Stairs-Number of Steps: 2 Home Layout: Two level;Able  to live on main level with bedroom/bathroom (Wash room down 3 steps) Home Equipment: Grab bars - tub/shower;Walker - 2 wheels (Walking stick)      Prior Function Level of Independence: Needs assistance   Gait / Transfers Assistance Needed: uses walker inside and outside  ADL's / Homemaking Assistance Needed: no longer gets in shower (needed  assist); assist with all housekeeping  Comments: uses walker inside and outside     Hand Dominance   Dominant Hand: Right    Extremity/Trunk Assessment   Upper Extremity Assessment: Defer to OT evaluation           Lower Extremity Assessment: Generalized weakness      Cervical / Trunk Assessment: Other exceptions  Communication   Communication: No difficulties  Cognition Arousal/Alertness: Lethargic Behavior During Therapy: Anxious Overall Cognitive Status: Impaired/Different from baseline Area of Impairment: Orientation;Safety/judgement;Following commands;Memory Orientation Level: Disoriented to;Situation   Memory: Decreased short-term memory Following Commands: Follows one step commands with increased time;Follows one step commands inconsistently Safety/Judgement: Decreased awareness of safety;Decreased awareness of deficits     General Comments: Pt can answer orientation questions correctly, however will occasionally make a statement that is not consistent with situation. Pt states that she doesn't know why she's here and no one will tell her. Believes she has had a stroke. Pt was reoriented to situation, however continues to state that the therapist is "keeping something" from her.     General Comments      Exercises        Assessment/Plan    PT Assessment Patient needs continued PT services  PT Diagnosis Difficulty walking;Abnormality of gait   PT Problem List Decreased strength;Decreased range of motion;Decreased activity tolerance;Decreased balance;Decreased mobility;Decreased knowledge of use of DME;Decreased safety awareness;Decreased knowledge of precautions;Obesity  PT Treatment Interventions DME instruction;Gait training;Stair training;Functional mobility training;Therapeutic activities;Therapeutic exercise;Neuromuscular re-education;Patient/family education   PT Goals (Current goals can be found in the Care Plan section) Acute Rehab PT Goals Patient  Stated Goal: Pt unable to state goals at this time.  PT Goal Formulation: With patient Time For Goal Achievement: 11/24/13 Potential to Achieve Goals: Fair    Frequency Min 2X/week   Barriers to discharge Decreased caregiver support Per chart review, family has stated that they cannot physically care for her at home. Son present during session and states the same.     Co-evaluation               End of Session Equipment Utilized During Treatment: Gait belt Activity Tolerance: Patient limited by fatigue;Patient limited by lethargy Patient left: in bed;with call bell/phone within reach;with family/visitor present Nurse Communication: Mobility status;Need for lift equipment         Time: 1308-6578 PT Time Calculation (min): 28 min   Charges:   PT Evaluation $PT Re-evaluation: 1 Procedure PT Treatments $Therapeutic Activity: 23-37 mins   PT G Codes:          Ruthann Cancer 11/10/2013, 3:36 PM  Ruthann Cancer, PT, DPT Acute Rehabilitation Services Pager: [phone]

## 2013-11-10 NOTE — Progress Notes (Signed)
Rehab Admissions Coordinator Note:  Patient was screened by Trish Mage for appropriateness for an Inpatient Acute Rehab Consult.  At this time, we are recommending Inpatient Rehab consult.  Trish Mage 11/10/2013, 3:54 PM  I can be reached at [phone].

## 2013-11-10 NOTE — Progress Notes (Signed)
Subjective: Nurse notes reviewed. Delirium continues. Some Hypotension. Xanax made prn. Abx and Lasix converted to PO Narcotic doses lowered.  She is very emotional and confused this am.  Tossing and turning in bed.  Objective: Vital signs in last 24 hours: Temp:  [97.9 F (36.6 C)-98.3 F (36.8 C)] 98.1 F (36.7 C) (09/28 0651) Pulse Rate:  [78-94] 94 (09/28 0651) Resp:  [16-20] 20 (09/28 0651) BP: (94-124)/(49-61) 124/61 mmHg (09/28 0651) SpO2:  [94 %-99 %] 97 % (09/27 2230) Weight:  [165.563 kg (365 lb)] 165.563 kg (365 lb) (09/27 0811)  Intake/Output from previous day: 09/27 0701 - 09/28 0700 In: 420 [P.O.:420] Out: -  Intake/Output this shift:   General appearance: Gen poor health, Morbid Obesity, No distress, Tossing and turning in bed. Eyes: no scleral icterus  Throat: oropharynx moist without erythema  Resp: Distant rhonchi  Cardio: Irreg, m  GI: Hard at lower Ab but abd pannus is less red and less firm - soft, non-tender; bowel sounds normal; no masses, no organomegaly  Extremities: excoriated, LE edema is a lot better and lesions are healing.  She is wrapped and doing well Neuro Delirium s acute issues.  Neuro intact.    Lab Results   Recent Labs  11/09/13 0404 11/10/13 0430  WBC 8.6 9.1  RBC 3.96 4.00  HGB 10.0* 10.5*  HCT 32.7* 33.9*  MCV 82.6 84.8  MCH 25.3* 26.3  RDW 17.5* 17.8*  PLT 318 303    Recent Labs  11/09/13 0404 11/10/13 0430  NA 136* 136*  K 3.9 3.9  CL 97 98  CO2 24 23  GLUCOSE 141* 161*  BUN 21 18  CREATININE 1.11* 0.99  CALCIUM 8.9 8.7    Studies/Results: Dg Chest Port 1 View  11/09/2013   CLINICAL DATA:  Pneumonia.  EXAM: PORTABLE CHEST - 1 VIEW  COMPARISON:  11/04/2013  FINDINGS: The heart is enlarged. Patient is rotated. There is significant airspace filling within the lungs, right greater than left. Findings favor infection but edema is also a consideration.  IMPRESSION: 1. Cardiomegaly and asymmetric airspace  filling, right greater than left. 2. Considerations include pneumonia or asymmetric pulmonary edema.   Electronically Signed   By: Rosalie Gums M.D.   On: 11/09/2013 19:18    Scheduled Meds: . digoxin  0.125 mg Oral Daily  . diltiazem  240 mg Oral Daily  . doxycycline  100 mg Oral Q12H  . enoxaparin (LOVENOX) injection  40 mg Subcutaneous Q12H  . furosemide  20 mg Oral Daily  . gabapentin  200 mg Oral BID  . hydrocerin   Topical Daily  . insulin aspart  0-15 Units Subcutaneous TID WC  . insulin aspart  0-5 Units Subcutaneous QHS  . insulin aspart  4 Units Subcutaneous TID WC  . insulin glargine  18 Units Subcutaneous QHS  . mupirocin cream   Topical BID  . polyethylene glycol  17 g Oral Daily  . spironolactone  25 mg Oral Daily   Continuous Infusions:  PRN Meds:acetaminophen, albuterol, ALPRAZolam, HYDROcodone-acetaminophen, traMADol  Assessment/Plan: Panniculitis/Leukocytosis/Cellulitis - doing a lot better, changes to PO Abx.  When mentally fine and moving well OK for D/c.  Wound care saw pt 9/23 and recommended: Bactroban to promote healing to partial thickness leg wounds. Protect with foam dressings to absorb drainage. Eucerin cream to provide moist healing to skin surrounding wounds on bilat legs. Aquacel to absorb drainage and provide antimicrobial benefits to left foot wound.  Delirium and worsening function - Xanax  dosing lowered and made prn.  Neurontin dose lowered from 400 per day to 300.  O/w meds and labs reviewed and make sense.  Narcotic dosing lowered as well.  Check Ammonia and repeat UA.  Doubt CCT would help.  Haldol prn for safety if needed.  May need sitter?  Hopefully her delirium is sundowning plus meds and will improve quickly  PNA -less cough Repeat CXR showed - Cardiomegaly and asymmetric airspace filling, right greater than left. Considerations include pneumonia or asymmetric pulmonary edema.  AFIb RVR - Cardizem/Dig/lovenox. She has refused Coumadin several in  the past as she was on it for a little while and come off on her own. She has also refused the newer agents. On lovenox for DVT proph only  CHF - Diastolic/R Sided c recurring Anasarca EF still fine PA pressure 45, mild PR  Fluid status is clearly better.  Convert Lasix to PO as some hypotension noted. ? If we can trust the weights obtained?? 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  Anemia - Hbg stable in 10.0's - iron levels low, refuses eval  Hyperlipidemia.  Chronic Low back pain/Degenerative disk disease/Gait Instability/Fall Risk/Chronic OA - seen by PT but they already signed off  DM2 /Neuropathy. - Gabapentin was increased Chronic Venous insufficiency with chronic venous stasis c lower extremity edema. On Diuretics History recurrent cellulitis/extensive groin abscess. No more issues of this magnitude  Chronic pain. Under control  Chronic urticaria.  Anxiety  LEG PAIN: less of an issue    LOS: 6 days   Kyser Wandel M 11/10/2013, 7:12 AM

## 2013-11-10 NOTE — Progress Notes (Signed)
INITIAL NUTRITION ASSESSMENT  DOCUMENTATION CODES Per approved criteria  -Morbid Obesity   INTERVENTION: Provide Glucerna Shakes BID Provide Pro-stat BID Provide Multivitamin with minerals daily  NUTRITION DIAGNOSIS: Inadequate oral intake related to delirium as evidenced by minimal PO intake for 2 days..   Goal: Pt to meet >/= 90% of their estimated nutrition needs   Monitor:  PO intake, weight trend, labs, I/O's, wounds  Reason for Assessment: Poor PO intake  68 y.o. female  Admitting Dx: Acute panniculitis  ASSESSMENT: 68 YO WF with pulm htn, chronic R CHF, poorly controlled DM, and COPD presents with leg swelling and fever. She has worsening pain and firmness of her large overhanging pannus. Pt found to have Panniculitis/Leukocytosis/Cellulitis. Per MD note pt with protein calorie malnutrition.    RD following up to review diet education material with pt. Per MD note pt is confused with delirium. Per RN pt is doing a little bit better this afternoon; however, not likely appropriate for diet education. RN reports pt was previously eating but, now she is not. Per nursing notes pt ate 50-60% of 2 meals yesterday, and 10% to 85% the few days before but, 0% today. Pt being cleaned up by staff at time of first visit and asleep at time of second visit with lunch tray at bedside mainly untouched.   Labs: low hemoglobin, low sodium  Height: Ht Readings from Last 1 Encounters:  11/05/13  (1.626 m)    Weight: Wt Readings from Last 1 Encounters:  11/09/13 365 lb (165.563 kg)    Ideal Body Weight: 120 lbs  % Ideal Body Weight: 304%  Wt Readings from Last 10 Encounters:  11/09/13 365 lb (165.563 kg)    Usual Body Weight: unknown  % Usual Body Weight: NA  BMI:  Body mass index is 62.62 kg/(m^2).  Estimated Nutritional Needs: Kcal: 2200-2400 Protein: 130-140 grams Fluid: 2.2-2.4 L/day  Skin:   Diet Order: Carb Control  EDUCATION NEEDS: -Education not  appropriate at this time   Intake/Output Summary (Last 24 hours) at 11/10/13 1347 Last data filed at 11/10/13 0911  Gross per 24 hour  Intake    370 ml  Output      0 ml  Net    370 ml    Last BM: 9/22  Labs:   Recent Labs Lab 11/08/13 0456 11/09/13 0404 11/10/13 0430  NA 136* 136* 136*  K 3.7 3.9 3.9  CL 95* 97 98  CO2 BUN 24* 21 18  CREATININE 1.08 1.11* 0.99  CALCIUM 8.8 8.9 8.7  GLUCOSE 178* 141* 161*    CBG (last 3)   Recent Labs  11/09/13 2104 11/10/13 0714 11/10/13 1100  GLUCAP 169* 154* 167*    Scheduled Meds: . digoxin  0.125 mg Oral Daily  . diltiazem  240 mg Oral Daily  . doxycycline  100 mg Oral Q12H  . enoxaparin (LOVENOX) injection  40 mg Subcutaneous Q12H  . furosemide  20 mg Oral Daily  . gabapentin  100 mg Oral TID  . hydrocerin   Topical Daily  . insulin aspart  0-15 Units Subcutaneous TID WC  . insulin aspart  0-5 Units Subcutaneous QHS  . insulin aspart  4 Units Subcutaneous TID WC  . insulin glargine  18 Units Subcutaneous QHS  . mupirocin cream   Topical BID  . polyethylene glycol  17 g Oral Daily  . spironolactone  25 mg Oral Daily    Continuous Infusions:   Past  Medical History  Diagnosis Date  . Atrial fibrillation   . Hypertension   . History of blood transfusion 1975    "w/my daughter's birth"  . CHF (congestive heart failure)   . Type II diabetes mellitus   . Anemia   . Anxiety   . Arthritis     "hips down" (11/06/2013)  . Diabetic peripheral neuropathy     Past Surgical History  Procedure Laterality Date  . Cholecystectomy  1999  . Incision and drainage abscess  2002    "in my groin"  . Cesarean section  1975; 1985  . Cataract extraction w/ intraocular lens implant Left 2013    Ian Malkin RD, LDN Inpatient Clinical Dietitian Pager: [phone] After Hours Pager: [phone]

## 2013-11-10 NOTE — Progress Notes (Signed)
Pt confused and somewhat combative at start of night shift tonight. Pt pulled out IV, pulled off her oxygen repeatedly, pulled off clothes repeatedly, and started banging her call light against the bed rails. Pt also started scratching her left shoulder & axilla, drawing blood. RN later replaced IV and applied Mepilex foams to scratched skin. Will continue to monitor.

## 2013-11-10 NOTE — Progress Notes (Signed)
UR completed Estevan Kersh K. Evanell Redlich, RN, BSN, MSHL, CCM  11/10/2013 12:34 PM

## 2013-11-10 NOTE — Progress Notes (Signed)
Attempted to get patient out of bed with 2 staff members assisting to chair.  Patient was unable to sit up in the bed.  Patient ended up being repositioned in the bed and told to call if she needs assistance.

## 2013-11-11 DIAGNOSIS — R5381 Other malaise: Secondary | ICD-10-CM

## 2013-11-11 DIAGNOSIS — E669 Obesity, unspecified: Secondary | ICD-10-CM

## 2013-11-11 LAB — CULTURE, BLOOD (ROUTINE X 2): Culture: NO GROWTH

## 2013-11-11 LAB — GLUCOSE, CAPILLARY
GLUCOSE-CAPILLARY: 155 mg/dL — AB (ref 70–99)
Glucose-Capillary: 151 mg/dL — ABNORMAL HIGH (ref 70–99)
Glucose-Capillary: 145 mg/dL — ABNORMAL HIGH (ref 70–99)
Glucose-Capillary: 159 mg/dL — ABNORMAL HIGH (ref 70–99)

## 2013-11-11 MED ORDER — SORBITOL 70 % SOLN
30.0000 mL | Freq: Every day | Status: DC | PRN
  Filled 2013-11-11: qty 30

## 2013-11-11 MED ORDER — BISACODYL 10 MG RE SUPP
10.0000 mg | Freq: Once | RECTAL | Status: AC
  Administered 2013-11-11: 10 mg via RECTAL
  Filled 2013-11-11: qty 1

## 2013-11-11 NOTE — Progress Notes (Signed)
I met with pt and her spouse at bedside. Spouse can not provide the needed assistance for pt after an inpt rehab stay that will be needed. He wants and pt will need prolonged rehab before returning home. We recommend SNF and pt and her spouse are in agreement. I have discussed with SW. [phone]

## 2013-11-11 NOTE — Progress Notes (Signed)
Clinical Social Work Department BRIEF PSYCHOSOCIAL ASSESSMENT 11/11/2013  Patient:  Joy Benitez, Joy Benitez     Account Number:  [id number]     Admit date:  11/04/2013  Clinical Social Worker:  Harless Nakayama  Date/Time:  11/11/2013 11:00 AM  Referred by:  Physician  Date Referred:  11/11/2013 Referred for  SNF Placement  SNF Placement   Other Referral:   Interview type:  Family Other interview type:    PSYCHOSOCIAL DATA Living Status:  HUSBAND Admitted from facility:   Level of care:   Primary support name:  Daneille Winokur Primary support relationship to patient:  SPOUSE Degree of support available:   Pt has strong family support    CURRENT CONCERNS Current Concerns  Post-Acute Placement  Post-Acute Placement   Other Concerns:    SOCIAL WORK ASSESSMENT / PLAN CSW notified that pt husband has concerns about managing pt care at home. CSW spoke with pt husband over the phone about SNF option. Pt husband stated that he understand [facility] re[facility] and this is definitely something he wants to consider. Pt husband informed CSW that he would like for pt to return home but he would not be able to provide her with adequate care at this time. Pt has not been to a SNF before but pt husband stated he has a preference for [facility] or somewhere close to the hospital. CSW explained SNF referral process and pt husband is agreeable to referral being sent out to all [facility]. CSW to provide pt husband with bed offers when available.   Assessment/plan status:  Psychosocial Support/Ongoing Assessment of Needs Other assessment/ plan:   Information/referral to community resources:   SNF list to be provided with bed offers    PATIENT'S/FAMILY'S RESPONSE TO PLAN OF CARE: Pt husband is agreeable to SNF       Parminder Cupples, LCSWA [phone]

## 2013-11-11 NOTE — Progress Notes (Signed)
Subjective:  Delirium continues but better than last few days. She had a decent day yesterday as well. She is now alert and oriented - just a bit off. She is upset at how weak she is. No BM in 7 days  Objective: Vital signs in last 24 hours: Temp:  [97.3 F (36.3 C)-98 F (36.7 C)] 98 F (36.7 C) (09/29 0432) Pulse Rate:  [77-94] 82 (09/29 0432) Resp:  [18] 18 (09/29 0432) BP: (105-123)/(52-61) 119/52 mmHg (09/29 0432) SpO2:  [97 %-99 %] 99 % (09/29 0432) Weight:  [165.8 kg (365 lb 8.4 oz)] 165.8 kg (365 lb 8.4 oz) (09/29 0432)  Intake/Output from previous day: 09/28 0701 - 09/29 0700 In: 180 [P.O.:180] Out: -  Intake/Output this shift:   General appearance: Gen poor health, Morbid Obesity, No distress, Tossing and turning in bed. Eyes: no scleral icterus  Throat: oropharynx moist without erythema  Resp: Distant rhonchi  Cardio: Irreg, m  GI: Hard at lower Ab but abd pannus is fine - soft, non-tender; bowel sounds normal; no masses, no organomegaly  Extremities: excoriated, LE edema is a lot better and lesions are healing.  She is wrapped and doing well Neuro Delirium is decently improved post med changes.  Neuro intact.    Lab Results   Recent Labs  11/09/13 0404 11/10/13 0430  WBC 8.6 9.1  RBC 3.96 4.00  HGB 10.0* 10.5*  HCT 32.7* 33.9*  MCV 82.6 84.8  MCH 25.3* 26.3  RDW 17.5* 17.8*  PLT 318 303    Recent Labs  11/09/13 0404 11/10/13 0430  NA 136* 136*  K 3.9 3.9  CL 97 98  CO2 24 23  GLUCOSE 141* 161*  BUN 21 18  CREATININE 1.11* 0.99  CALCIUM 8.9 8.7    Studies/Results: Dg Chest Port 1 View  11/09/2013   CLINICAL DATA:  Pneumonia.  EXAM: PORTABLE CHEST - 1 VIEW  COMPARISON:  11/04/2013  FINDINGS: The heart is enlarged. Patient is rotated. There is significant airspace filling within the lungs, right greater than left. Findings favor infection but edema is also a consideration.  IMPRESSION: 1. Cardiomegaly and asymmetric airspace filling, right  greater than left. 2. Considerations include pneumonia or asymmetric pulmonary edema.   Electronically Signed   By: Rosalie GumsBeth  Brown M.D.   On: 11/09/2013 19:18    Scheduled Meds: . digoxin  0.125 mg Oral Daily  . diltiazem  240 mg Oral Daily  . doxycycline  100 mg Oral Q12H  . enoxaparin (LOVENOX) injection  40 mg Subcutaneous Q12H  . feeding supplement (GLUCERNA SHAKE)  237 mL Oral BID PC  . feeding supplement (PRO-STAT SUGAR FREE 64)  30 mL Oral TID WC  . furosemide  20 mg Oral Daily  . gabapentin  100 mg Oral TID  . hydrocerin   Topical Daily  . insulin aspart  0-15 Units Subcutaneous TID WC  . insulin aspart  0-5 Units Subcutaneous QHS  . insulin aspart  4 Units Subcutaneous TID WC  . insulin glargine  18 Units Subcutaneous QHS  . multivitamin with minerals  1 tablet Oral Daily  . mupirocin cream   Topical BID  . nicotine  7 mg Transdermal Q24H  . polyethylene glycol  17 g Oral Daily  . spironolactone  25 mg Oral Daily   Continuous Infusions:  PRN Meds:acetaminophen, albuterol, ALPRAZolam, haloperidol, haloperidol lactate, HYDROcodone-acetaminophen, traMADol  Assessment/Plan: Panniculitis/Leukocytosis/Cellulitis - doing a lot better, finish PO Abx.  When mentally fine and moving well OK for D/c -  however she is very weak and will need rehab from here.  CIR ordered.  Wound care saw pt 9/23 and recommended: Bactroban to promote healing to partial thickness leg wounds. Protect with foam dressings to absorb drainage. Eucerin cream to provide moist healing to skin surrounding wounds on bilat legs. Aquacel to absorb drainage and provide antimicrobial benefits to left foot wound.  Delirium and worsening function -  Haldol prn for safety if needed.  Delirium some better today  PNA -less cough Repeat CXR showed - Cardiomegaly and asymmetric airspace filling, right greater than left. Considerations include pneumonia or asymmetric pulmonary edema.   AFIb RVR - Cardizem/Dig/lovenox. She has  refused Coumadin several in the past as she was on it for a little while and come off on her own. She has also refused the newer agents. On lovenox for DVT proph only  CHF - Diastolic/R Sided c recurring Anasarca EF still fine PA pressure 45, mild PR  Fluid status is clearly better.  Lasix is now PO as some hypotension noted.  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  Nicotene patch ordered  Hypertension - BP fine still but some recent hypotension - Monitor.  Anemia - Hbg stable in 10.0's - iron levels low Hyperlipidemia.  Chronic Low back pain/Degenerative disk disease/Gait Instability/Fall Risk/Chronic OA - seen by PT again yesterday c recs of Supervision/Assistance - 24 hour;CIR  DM2 /Neuropathy. - Gabapentin Chronic Venous insufficiency with chronic venous stasis c lower extremity edema. On Diuretics  Constipation - Dulcolax supp.    LOS: 7 days   Joy Benitez M 11/11/2013, 8:21 AM

## 2013-11-11 NOTE — Consult Note (Signed)
Physical Medicine and Rehabilitation Consult Reason for Consult: Debilitated/Panniculitis/cellulitis Referring Physician: Dr. Timothy Lasso   HPI: Joy Benitez is a 68 y.o. right-handed female with history of diastolic congestive heart failure, poorly controlled diabetes mellitus, atrial fibrillation refusing anti-coagulation in the past, tobacco abuse and recurrent lower extremity cellulitis. Patient lives with her husband and used a walker prior to admission. Admitted 11/04/2013 with increasing leg swelling. Denied any fever chills or sweats. Chest x-ray showed asymmetric opacity at the right base cannot exclude pneumonia. Left tibia and fibula films showed soft tissue edema. No acute abnormalities. Echocardiogram ejection fraction of 55% normal systolic function. Placed on broad-spectrum antibiotics for suspect cellulitis. Wound care nurse followup for lower extremity skin wounds. Subcutaneous Lovenox for DVT prophylaxis. Bouts of confusion delirium may continue to improve and has received Haldol. Currently maintained on doxycycline for cellulitis. NicoDerm patch was added for tobacco abuse. Physical therapy evaluation completed 11/10/2013 with recommendations for physical medicine rehabilitation consult.  Review of Systems  Respiratory: Positive for shortness of breath.   Cardiovascular: Positive for palpitations and leg swelling.  Musculoskeletal: Positive for joint pain and myalgias.  Psychiatric/Behavioral:       Anxiety  All other systems reviewed and are negative.  Past Medical History  Diagnosis Date  . Atrial fibrillation   . Hypertension   . History of blood transfusion 1975    "w/my daughter's birth"  . CHF (congestive heart failure)   . Type II diabetes mellitus   . Anemia   . Anxiety   . Arthritis     "hips down" (11/06/2013)  . Diabetic peripheral neuropathy    Past Surgical History  Procedure Laterality Date  . Cholecystectomy  1999  . Incision and drainage  abscess  2002    "in my groin"  . Cesarean section  1975; 1985  . Cataract extraction w/ intraocular lens implant Left 2013   History reviewed. No pertinent family history. Social History:  reports that she has been smoking Cigarettes.  She has a 12 pack-year smoking history. She has never used smokeless tobacco. She reports that she does not drink alcohol or use illicit drugs. Allergies:  Allergies  Allergen Reactions  . Levaquin [Levofloxacin In D5w] Itching   Medications Prior to Admission  Medication Sig Dispense Refill  . ALPRAZolam (XANAX) 1 MG tablet Take 1 mg by mouth 2 (two) times daily as needed for anxiety.       Marland Kitchen CARTIA XT 120 MG 24 hr capsule Take 120 mg by mouth daily.      Marland Kitchen DIGOX 125 MCG tablet Take 0.0625 mg by mouth daily.       . furosemide (LASIX) 20 MG tablet Take 60 mg by mouth 2 (two) times daily.       Marland Kitchen gabapentin (NEURONTIN) 300 MG capsule Take 600 mg by mouth 2 (two) times daily.       Marland Kitchen HYDROcodone-acetaminophen (NORCO/VICODIN) 5-325 MG per tablet Take 1 tablet by mouth every 6 (six) hours as needed for moderate pain.      . metFORMIN (GLUCOPHAGE) 500 MG tablet Take 500 mg by mouth 2 (two) times daily.      Marland Kitchen spironolactone (ALDACTONE) 25 MG tablet Take 25 mg by mouth daily.        Home: Home Living Family/patient expects to be discharged to:: Skilled nursing facility Living Arrangements: Spouse/significant other;Other relatives Available Help at Discharge: Family;Available 24 hours/day Type of Home: House Home Access: Stairs to enter [employer] of Steps:  2 Entrance Stairs-Rails: Right Home Layout: Two level;Able to live on main level with bedroom/bathroom (Wash room down 3 steps) Home Equipment: Grab bars - tub/shower;Walker - 2 wheels (Walking stick)  Functional History: Prior Function Level of Independence: Needs assistance Gait / Transfers Assistance Needed: uses walker inside and outside ADL's / Homemaking Assistance Needed: no  longer gets in shower (needed assist); assist with all housekeeping Comments: uses walker inside and outside Functional Status:  Mobility: Bed Mobility Overal bed mobility: Needs Assistance;+2 for physical assistance Bed Mobility: Supine to Sit;Sit to Supine Supine to sit: HOB elevated;Mod assist;+2 for physical assistance Sit to supine: Max assist;+2 for physical assistance General bed mobility comments: Bed pad used to assist in scooting hips around. +2 assist for trunk elevation and to position LE's off EOB.  Transfers Overall transfer level: Needs assistance Equipment used: Rolling walker (2 wheeled) Transfers: Sit to/from Stand Sit to Stand: Max assist;+2 physical assistance;+2 safety/equipment General transfer comment: Pt was cued for hand placement on seated surface for safety. Bed was elevated and max +2 was provided, however pt was not able to achieve full standing at EOB. Transfer was attempted x3. Pt had difficulty extending hips and trunk, and was bent over onto the walker.  Ambulation/Gait Ambulation/Gait assistance: Supervision Ambulation Distance (Feet): 80 Feet Assistive device: Rolling walker (2 wheeled) Gait Pattern/deviations: Step-to pattern;Shuffle;Decreased stride length;Trunk flexed Gait velocity: decr General Gait Details: SPT to recliner as well as any further ambulation was deferred due to safety. Pt eager to sit up in chair, however feel the lift is more appropriate at this time.     ADL:    Cognition: Cognition Overall Cognitive Status: Impaired/Different from baseline Orientation Level: Oriented X4 Cognition Arousal/Alertness: Lethargic Behavior During Therapy: Anxious Overall Cognitive Status: Impaired/Different from baseline Area of Impairment: Orientation;Safety/judgement;Following commands;Memory Orientation Level: Disoriented to;Situation Memory: Decreased short-term memory Following Commands: Follows one step commands with increased  time;Follows one step commands inconsistently Safety/Judgement: Decreased awareness of safety;Decreased awareness of deficits General Comments: Pt can answer orientation questions correctly, however will occasionally make a statement that is not consistent with situation. Pt states that she doesn't know why she's here and no one will tell her. Believes she has had a stroke. Pt was reoriented to situation, however continues to state that the therapist is "keeping something" from her.   Blood pressure 119/52, pulse 82, temperature 98 F (36.7 C), temperature source Oral, resp. rate 18, height 5\' 4"  (1.626 m), weight 165.8 kg (365 lb 8.4 oz), SpO2 99.00%. Physical Exam  Vitals reviewed. Constitutional:  68 year old obese female  HENT:  Head: Normocephalic.  Eyes: EOM are normal.  Neck: Normal range of motion. Neck supple. No thyromegaly present.  Cardiovascular:  Cardiac rate controlled  Respiratory: Effort normal and breath sounds normal. No respiratory distress.  GI: Soft. Bowel sounds are normal. She exhibits no distension.  Neurological:  Patient is alert. She did need some cues for age and date of birth. She was restless during exam and mostly Her eyes closed. She did follow simple commands. Moves all 4's. UE's 4/5 prox to distal. LE: HF 2/5, KE 3/5. Ankles 4/5. Decreased PP/LT in both legs below the knees. Minimal sensory loss in the hands.   Skin:  Cellulitic ischemic changes bilateral lower extremities with dressings in place in both distal legs.  Psychiatric:  flat    Results for orders placed during the hospital encounter of 11/04/13 (from the past 24 hour(s))  GLUCOSE, CAPILLARY     Status: Abnormal  Collection Time    11/10/13  7:14 AM      Result Value Ref Range   Glucose-Capillary 154 (*) 70 - 99 mg/dL   Comment 1 Notify RN     Comment 2 Documented in Chart    AMMONIA     Status: Abnormal   Collection Time    11/10/13  8:00 AM      Result Value Ref Range   Ammonia 61  (*) 11 - 60 umol/L  GLUCOSE, CAPILLARY     Status: Abnormal   Collection Time    11/10/13 11:00 AM      Result Value Ref Range   Glucose-Capillary 167 (*) 70 - 99 mg/dL  GLUCOSE, CAPILLARY     Status: Abnormal   Collection Time    11/10/13  4:32 PM      Result Value Ref Range   Glucose-Capillary 145 (*) 70 - 99 mg/dL  GLUCOSE, CAPILLARY     Status: Abnormal   Collection Time    11/10/13  8:53 PM      Result Value Ref Range   Glucose-Capillary 147 (*) 70 - 99 mg/dL   Dg Chest Port 1 View  11/09/2013   CLINICAL DATA:  Pneumonia.  EXAM: PORTABLE CHEST - 1 VIEW  COMPARISON:  11/04/2013  FINDINGS: The heart is enlarged. Patient is rotated. There is significant airspace filling within the lungs, right greater than left. Findings favor infection but edema is also a consideration.  IMPRESSION: 1. Cardiomegaly and asymmetric airspace filling, right greater than left. 2. Considerations include pneumonia or asymmetric pulmonary edema.   Electronically Signed   By: Rosalie Gums M.D.   On: 11/09/2013 19:18    Assessment/Plan: Diagnosis: deconditioning related to cellulitis and multiple medical issues 1. Does the need for close, 24 hr/day medical supervision in concert with the patient's rehab needs make it unreasonable for this patient to be served in a less intensive setting? Yes 2. Co-Morbidities requiring supervision/potential complications: chf, morbid obesity, diabetes with DPN 3. Due to bladder management, bowel management, safety, skin/wound care, disease management, medication administration, pain management and patient education, does the patient require 24 hr/day rehab nursing? Yes 4. Does the patient require coordinated care of a physician, rehab nurse, PT (1-2 hrs/day, 5 days/week) and OT (1-2 hrs/day, 5 days/week) to address physical and functional deficits in the context of the above medical diagnosis(es)? Yes Addressing deficits in the following areas: balance, endurance, locomotion,  strength, transferring, bowel/bladder control, bathing, dressing, feeding and grooming 5. Can the patient actively participate in an intensive therapy program of at least 3 hrs of therapy per day at least 5 days per week? Yes 6. The potential for patient to make measurable gains while on inpatient rehab is excellent 7. Anticipated functional outcomes upon discharge from inpatient rehab are supervision and min assist  with PT, supervision and min assist with OT, n/a with SLP. 8. Estimated rehab length of stay to reach the above functional goals is: 10-15 days 9. Does the patient have adequate social supports to accommodate these discharge functional goals? Yes and Potentially 10. Anticipated D/C setting: Home 11. Anticipated post D/C treatments: HH therapy and Outpatient therapy 12. Overall Rehab/Functional Prognosis: excellent  RECOMMENDATIONS: This patient's condition is appropriate for continued rehabilitative care in the following setting: CIR Patient has agreed to participate in recommended program. Yes Note that insurance prior authorization may be required for reimbursement for recommended care.  Comment: Rehab Admissions Coordinator to follow up.  Thanks,  Ranelle Oyster,  MD, [place] [facility]     11/11/2013

## 2013-11-11 NOTE — Progress Notes (Addendum)
Clinical Social Work Department CLINICAL SOCIAL WORK PLACEMENT NOTE 11/11/2013  Patient:  Joy Benitez, Joy Benitez  Account Number:  [id number] Admit date:  11/04/2013  Clinical Social Worker:  Sharol Harness, Theresia Majors  Date/time:  11/11/2013 11:15 AM  Clinical Social Work is seeking post-discharge placement for this patient at the following level of care:   SKILLED NURSING   (*CSW will update this form in Epic as items are completed)   11/11/2013  Patient/family provided with [facility] Department of Clinical Social Work's list of facilities offering this level of care within the geographic area requested by the patient (or if unable, by the patient's family).  11/11/2013  Patient/family informed of their freedom to choose among providers that offer the needed level of care, that participate in Medicare, Medicaid or managed care program needed by the patient, have an available bed and are willing to accept the patient.  11/11/2013  Patient/family informed of [facility]' ownership interest in [facility], as well as of the fact that they are under no obligation to receive care at this facility.  PASARR submitted to EDS on 11/11/2013 PASARR number received on 11/11/2013  FL2 transmitted to all facilities in geographic area requested by pt/family on  11/11/2013 FL2 transmitted to all facilities within larger geographic area on   Patient informed that his/her managed care company has contracts with or will negotiate with  certain facilities, including the following:     Patient/family informed of bed offers received: 11/11/2013   Patient chooses bed at [facility] Physician recommends and patient chooses bed at    Patient to be transferred [facility]  on  11/12/2013   Patient to be transferred to facility by [facility] Patient and family notified of transfer on 11/12/2013   Name of family member notified: Adria Dill    The following physician request were entered in  Epic: Physician Request  Please sign FL2.    Additional CommentsSharol Harness, LCSWA [phone]

## 2013-11-11 NOTE — Progress Notes (Signed)
CSW Proofreader) spoke with pt and pt husband at bedside and provided with bed offers. They would like to accept bed offer at [facility]. Both pt and pt husband expressed that they were very happy with this plan and agreeable to dc for tomorrow.   Austin Pongratz, LCSWA [phone]

## 2013-11-12 LAB — GLUCOSE, CAPILLARY
Glucose-Capillary: 134 mg/dL — ABNORMAL HIGH (ref 70–99)
Glucose-Capillary: 163 mg/dL — ABNORMAL HIGH (ref 70–99)

## 2013-11-12 MED ORDER — GLUCERNA SHAKE PO LIQD: 237.0000 mL | Freq: Two times a day (BID) | ORAL | Status: DC

## 2013-11-12 MED ORDER — POLYETHYLENE GLYCOL 3350 17 G PO PACK: 17.0000 g | PACK | Freq: Every day | ORAL | Status: AC

## 2013-11-12 MED ORDER — INSULIN ASPART 100 UNIT/ML ~~LOC~~ SOLN: SUBCUTANEOUS | Status: AC

## 2013-11-12 MED ORDER — DILTIAZEM HCL ER COATED BEADS 120 MG PO CP24
120.0000 mg | ORAL_CAPSULE | Freq: Every day | ORAL | Status: DC
  Administered 2013-11-12: 120 mg via ORAL
  Filled 2013-11-12: qty 1

## 2013-11-12 MED ORDER — HYDROCODONE-ACETAMINOPHEN 5-325 MG PO TABS: 1.0000 | ORAL_TABLET | Freq: Three times a day (TID) | ORAL | Status: AC | PRN

## 2013-11-12 MED ORDER — INSULIN GLARGINE 100 UNIT/ML ~~LOC~~ SOLN: 18.0000 [IU] | Freq: Every day | SUBCUTANEOUS | Status: AC

## 2013-11-12 MED ORDER — HYDROCERIN EX CREA: 1.0000 "application " | TOPICAL_CREAM | Freq: Every day | CUTANEOUS | Status: AC

## 2013-11-12 MED ORDER — ADULT MULTIVITAMIN W/MINERALS CH: 1.0000 | ORAL_TABLET | Freq: Every day | ORAL | Status: AC

## 2013-11-12 MED ORDER — ALBUTEROL SULFATE (2.5 MG/3ML) 0.083% IN NEBU: 3.0000 mL | INHALATION_SOLUTION | Freq: Four times a day (QID) | RESPIRATORY_TRACT | Status: AC | PRN

## 2013-11-12 MED ORDER — DOXYCYCLINE HYCLATE 100 MG PO TABS: 100.0000 mg | ORAL_TABLET | Freq: Two times a day (BID) | ORAL | Status: AC

## 2013-11-12 MED ORDER — FUROSEMIDE 40 MG PO TABS
40.0000 mg | ORAL_TABLET | Freq: Every day | ORAL | Status: DC
  Administered 2013-11-12: 40 mg via ORAL
  Filled 2013-11-12: qty 1

## 2013-11-12 MED ORDER — SORBITOL 70 % SOLN: 30.0000 mL | Freq: Every day | Status: DC | PRN

## 2013-11-12 MED ORDER — ALPRAZOLAM 1 MG PO TABS
0.5000 mg | ORAL_TABLET | Freq: Two times a day (BID) | ORAL | Status: DC | PRN
Start: 2013-11-12 — End: 2014-02-15

## 2013-11-12 MED ORDER — NICOTINE 7 MG/24HR TD PT24: 7.0000 mg | MEDICATED_PATCH | TRANSDERMAL | Status: AC

## 2013-11-12 MED ORDER — MUPIROCIN CALCIUM 2 % EX CREA
TOPICAL_CREAM | Freq: Two times a day (BID) | CUTANEOUS | Status: DC
Start: 2013-11-12 — End: 2013-11-26

## 2013-11-12 MED ORDER — PRO-STAT SUGAR FREE PO LIQD: 30.0000 mL | Freq: Three times a day (TID) | ORAL | Status: AC

## 2013-11-12 MED ORDER — ACETAMINOPHEN 325 MG PO TABS: 650.0000 mg | ORAL_TABLET | Freq: Four times a day (QID) | ORAL | Status: AC | PRN

## 2013-11-12 NOTE — Progress Notes (Signed)
Re-applied Bactroban 2% and Eucerin cream to bilateral lower extremities.  Changed dressings.  Wrapped in [place].

## 2013-11-12 NOTE — Progress Notes (Signed)
CSW (Clinical Social Worker) prepared pt dc packet and placed with shadow chart. CSW arranged non-emergent ambulance transport at 1:30pm. Pt, pt family, pt nurse, and facility informed. CSW signing off.  Aasir Daigler, LCSWA [phone]  

## 2013-11-12 NOTE — Discharge Summary (Signed)
Physician Discharge Summary  DISCHARGE SUMMARY   Patient ID: Joy Benitez MR#: [id number] DOB/AGE: Feb 23, 1945 68 y.o.   Attending Physician:Carena Stream M  Patient's ONG:EXBMW,UXLK M, MD  Consults:  none  Admit date: 11/04/2013 Discharge date: 11/12/2013  Discharge Diagnoses:  Principal Problem:   Acute panniculitis Active Problems:   Atrial fibrillation with tachycardic ventricular rate   Community acquired pneumonia   Type II or unspecified type diabetes mellitus with neurological manifestations, uncontrolled   Polyneuropathy in diabetes(357.2)   Morbid obesity   Cellulitis and abscess of leg   Cor pulmonale, chronic   Pulmonary hypertension   Anemia   Diastolic CHF with preserved left ventricular function, NYHA class 2   Obstructive chronic bronchitis without exacerbation   Smoker   Patient Active Problem List   Diagnosis Date Noted  . Diastolic CHF with preserved left ventricular function, NYHA class 2 11/05/2013  . Obstructive chronic bronchitis without exacerbation 11/05/2013  . Smoker 11/05/2013  . Acute panniculitis 11/04/2013  . Atrial fibrillation with tachycardic ventricular rate 11/04/2013  . Community acquired pneumonia 11/04/2013  . Type II or unspecified type diabetes mellitus with neurological manifestations, uncontrolled 11/04/2013  . Polyneuropathy in diabetes(357.2) 11/04/2013  . Morbid obesity 11/04/2013  . Cellulitis and abscess of leg 11/04/2013  . Cor pulmonale, chronic 11/04/2013  . Pulmonary hypertension 11/04/2013  . Anemia 11/04/2013   Past Medical History  Diagnosis Date  . Atrial fibrillation   . Hypertension   . History of blood transfusion 1975    "w/my daughter's birth"  . CHF (congestive heart failure)   . Type II diabetes mellitus   . Anemia   . Anxiety   . Arthritis     "hips down" (11/06/2013)  . Diabetic peripheral neuropathy     Discharged Condition: better than admission  Discharge Medications:   Medication  List         acetaminophen 325 MG tablet  Commonly known as:  TYLENOL  Take 2 tablets (650 mg total) by mouth every 6 (six) hours as needed for mild pain, moderate pain or fever.     albuterol (2.5 MG/3ML) 0.083% nebulizer solution  Commonly known as:  PROVENTIL  Inhale 3 mLs into the lungs every 6 (six) hours as needed for wheezing or shortness of breath.     ALPRAZolam 1 MG tablet  Commonly known as:  XANAX  Take 0.5 tablets (0.5 mg total) by mouth 2 (two) times daily as needed for anxiety.     CARTIA XT 120 MG 24 hr capsule  Generic drug:  diltiazem  Take 120 mg by mouth daily.     DIGOX 0.125 MG tablet  Generic drug:  digoxin  Take 0.0625 mg by mouth daily.     doxycycline 100 MG tablet  Commonly known as:  VIBRA-TABS  Take 1 tablet (100 mg total) by mouth every 12 (twelve) hours.     feeding supplement (GLUCERNA SHAKE) Liqd  Take 237 mLs by mouth 2 (two) times daily after a meal.     feeding supplement (PRO-STAT SUGAR FREE 64) Liqd  Take 30 mLs by mouth 3 (three) times daily with meals.     furosemide 20 MG tablet  Commonly known as:  LASIX  Take 60 mg by mouth 2 (two) times daily.     gabapentin 300 MG capsule  Commonly known as:  NEURONTIN  Take 600 mg by mouth 2 (two) times daily.     hydrocerin Crea  Apply 1 application topically daily.  HYDROcodone-acetaminophen 5-325 MG per tablet  Commonly known as:  NORCO/VICODIN  Take 1 tablet by mouth every 8 (eight) hours as needed for moderate pain.     insulin aspart 100 UNIT/ML injection  Commonly known as:  novoLOG  - CBG 70 - 120: 0 units  - CBG 121 - 150: 2 units  - CBG 151 - 200: 3 units  - CBG 201 - 250: 5 units  - CBG 251 - 300: 8 units  - CBG 301 - 350: 11 units  - CBG 351 - 400: 15 units  - CBG > 400: 20 units and call MD     insulin glargine 100 UNIT/ML injection  Commonly known as:  LANTUS  Inject 0.18 mLs (18 Units total) into the skin at bedtime.     metFORMIN 500 MG tablet   Commonly known as:  GLUCOPHAGE  Take 500 mg by mouth 2 (two) times daily.     multivitamin with minerals Tabs tablet  Take 1 tablet by mouth daily.     mupirocin cream 2 %  Commonly known as:  BACTROBAN  Apply topically 2 (two) times daily.     nicotine 7 mg/24hr patch  Commonly known as:  NICODERM CQ - dosed in mg/24 hr  Place 1 patch (7 mg total) onto the skin daily.     polyethylene glycol packet  Commonly known as:  MIRALAX / GLYCOLAX  Take 17 g by mouth daily.     sorbitol 70 % Soln  Take 30 mLs by mouth daily as needed for moderate constipation.     spironolactone 25 MG tablet  Commonly known as:  ALDACTONE  Take 25 mg by mouth daily.        Hospital Procedures: Dg Chest 2 View  11/04/2013   CLINICAL DATA:  Generalized weakness.  Initial encounter  EXAM: CHEST  2 VIEW  COMPARISON:  04/18/2010  FINDINGS: Chronic cardiopericardial enlargement. Aortic contours are unchanged.  There is cephalized blood flow and diffuse interstitial coarsening. Opacification is most dense at the right base. No significant effusion. No pneumothorax.  IMPRESSION: 1. Cardiomegaly and mild pulmonary edema. 2. Asymmetric opacity at the right base; cannot exclude pneumonia at this location.   Electronically Signed   By: Tiburcio Pea M.D.   On: 11/04/2013 20:55   Dg Tibia/fibula Left  11/08/2013   CLINICAL DATA:  Left lower leg pain and swelling.  EXAM: LEFT TIBIA AND FIBULA - 2 VIEW  COMPARISON:  None.  FINDINGS: Increased density in the subcutaneous fat of the left lower leg likely due to edema. No radiopaque soft tissue foreign bodies or gas collections. Tibia and fibula appear intact. No acute fractures or subluxations. No focal bone lesion or bone destruction. Degenerative changes in the knee and ankle.  IMPRESSION: Soft tissue edema. No radiopaque foreign bodies or gas collections. No acute bony abnormalities tree   Electronically Signed   By: Burman Nieves M.D.   On: 11/08/2013 00:33   Dg  Chest Port 1 View  11/09/2013   CLINICAL DATA:  Pneumonia.  EXAM: PORTABLE CHEST - 1 VIEW  COMPARISON:  11/04/2013  FINDINGS: The heart is enlarged. Patient is rotated. There is significant airspace filling within the lungs, right greater than left. Findings favor infection but edema is also a consideration.  IMPRESSION: 1. Cardiomegaly and asymmetric airspace filling, right greater than left. 2. Considerations include pneumonia or asymmetric pulmonary edema.   Electronically Signed   By: Rosalie Gums M.D.   On: 11/09/2013  19:18    History of Present Illness:  68 YO WF with pulm htn, chronic R CHF, poorly controlled DM, presented with several days of worsening leg swelling, pain, and fever to the ED on 11/04/13. There was worsening pain and firmness of her large overhanging pannus. Her LE was more edematous and more red. She is chronically SOB but has been coughing more. She continues to smoke.      Hospital Course:  25 F c MMP admitted 7/22 c Panniculitis, Leukocytosis, Cellulitis, PNA and AFIb RVR  Tm 100.4 overnight on day 1.  She has MMP and has stopped me from being too aggressive c management of her.  She had lots of pain from B Legs on admission and needed more narcotics on admission Her Abdominal Redness and tenderness improved quickly She underwent an ECHO which just showed R CHF c preserved EF. She was placed on Aggressive IV abx and moved to PO .  She will complete 10 full days. Her legs look much less red and aggrevated.  She has Chronic Venous Insufficiency, Chronic Urticaria, and Neuropathy.  She picks at her legs constantly and has significant excoriations.  She is on Neurontin.  She was told to stop picking.  We have B LE dressed at this point.  No current active infection or pus.  WBC count and fever are better. Wound Care and nutrition were helpful. Heart rate is better controlled She initially did well with PT and was headed for D/c towards 9/25 or so but her mental state  started deteriorating and she got Delirius.  This worsened through the weekend.  Meds adjusted.  She is finally back to baseline on 9/30.  She never had focal neuro deficits.  Amonia level was fine.  She is now week and will now need rehab to regain strength prior to returning home. Due to her weight and issues she was maintained in a [facility] bed.  I looked this am and she has no Sacral Decubitus ulcers.   Panniculitis/Leukocytosis/Cellulitis - now lots better, finish PO Abx after 10 days on Saturday - Finish Doxy. She is now mentally fine and moving well enough for D/c - however she is very weak and will need rehab from here. She will get rehab at [facility]. Wound care saw pt 9/23 and recommended: Bactroban to promote healing to partial thickness leg wounds. Protect with foam dressings to absorb drainage. Eucerin cream to provide moist healing to skin surrounding wounds on bilat legs. Aquacel to absorb drainage and provide antimicrobial benefits to left foot wound.   Delirium and worsening function - Haldol prn for safety was never needed. Delirium better today and mentally she is at baseline.  She got better c changing the dose and way Xanax was given, decreasing Narcotics and adjusting Neurontin.  I think it is safe to increase neurontin again.  PNA - This was always a soft call as I believe her primary issue was the LE Cellulitis.  She has minimal cough. Last Repeat CXR showed - Cardiomegaly and asymmetric airspace filling, right greater than left. Considerations include pneumonia or asymmetric pulmonary edema.   AFIb RVR - Cardizem/Dig/lovenox. Dig level was fine.  Cardizem dosing increased on admission when she had more RVR.  Heart rate is better and Dilt can be dropped back to the 120 dose so we have more room to maintain diuresis. She has refused Coumadin several times in the past as she was on it for a little while and come off on her  own. She has also refused the newer agents. She was on lovenox for  DVT proph only.  She had 3 (-) Trop I's on admit  CHF - Pro-BNP was only 103 on admit.  Diastolic/R Sided c recurring Anasarca EF still fine PA pressure 45, mild PR  Fluid status is clearly better than admission.  Lasix is now PO as some hypotension noted.  However she is tolerating 2d ECHO showed: - Left ventricle: Abnormal septal motion. The cavity size was normal. Wall thickness was increased in a pattern of severe LVH. Systolic function was normal. The estimated ejection fraction was in the range of 50% to 55%. - Left atrium: The atrium was moderately dilated. - Right ventricle: The cavity size was moderately dilated. - Right atrium: The atrium was moderately dilated. - Atrial septum: No defect or patent foramen ovale was identified. - Pulmonary arteries: PA peak pressure: 43 mm Hg (S).  Severe Pulmonary HTN/Cor Pulmonale from Obesity Hypoventilation and COPD - Needs to do better job at caring for self.  She has not wanted formal sleep study and CPA/BiPAP use Morbid obesity - Due to chronic illness she also has Prot Cal Malnutrition - See RD note.  Protein supplementation provided.  Her recorded wts in house went up but clearly her edema went down. ?Validity of weights recorded while in bed?  Current weight listed = 371.  This is much higher than baseline.  She does much better when weights are @ 320#s.  On March 23rd I sent her for Ab [place] to R/Out ascites and something to tap as she was doing poor that day - It showed:  Hepatomegaly with suspected hepatic steatosis.  Prior cholecystectomy.  No ascites is visualized.  COPD/Smoker - Needs to quit - Provide inhalers prn. Nicotene patch ordered  Hypertension - BP fine still but some recent hypotension - Monitor.  Anemia - Hbg stable in 10.0's - iron levels low  Hyperlipidemia.  Chronic Low back pain/Degenerative disk disease/Gait Instability/Fall Risk/Chronic OA - seen by PT again 9/28 c recs of Supervision/Assistance - 24 hour;CIR .  It was  determined that she is not a CIR pt and arrangements were made for SNF.  She is ready on 9/30 for transition. DM2 She was treated c Lantus ISS in house and Metformin was held.  She can resume metformin.  A1C was 7.1 on admit.  CBGs were fine. Neuropathy. - Gabapentin  Chronic Venous insufficiency with chronic venous stasis c lower extremity edema. On Diuretics  Constipation - Dulcolax supp and Bowel prep worked on 9/29  She is DNR and OOF DNR and FL-2 Signed on 9/30 CW wrote on 9/29 - CSW (Clinical Child psychotherapist) spoke with pt and pt husband at bedside and provided with bed offers. They would like to accept bed offer at [facility]. Both pt and pt husband expressed that they were very happy with this plan and agreeable to dc for tomorrow.         Day of Discharge Exam BP 98/49  Pulse 63  Temp(Src) 97.9 F (36.6 C) (Oral)  Resp 18  Ht 5\' 4"  (1.626 m)  Wt 168.3 kg (371 lb 0.6 oz)  BMI 63.66 kg/m2  SpO2 93%  Physical Exam:  General appearance: Gen poor health, Morbid Obesity, No distress - Alert and O today  Eyes: no scleral icterus  Throat: oropharynx moist without erythema  Resp: Distant rhonchi  Cardio: Irreg, m  GI: Hard at lower Ab but abd pannus is fine - soft, non-tender;  bowel sounds normal; no masses, no organomegaly  Extremities: excoriated, LE edema is a lot better and lesions are healing. She is wrapped and doing well  Neuro Delirium is back to her normal. Neuro intact.      Discharge Labs:  Recent Labs  11/10/13 0430  NA 136*  K 3.9  CL 98  CO2 23  GLUCOSE 161*  BUN 18  CREATININE 0.99  CALCIUM 8.7   No results found for this basename: AST, ALT, ALKPHOS, BILITOT, PROT, ALBUMIN,  in the last 72 hours  Recent Labs  11/10/13 0430  WBC 9.1  HGB 10.5*  HCT 33.9*  MCV 84.8  PLT 303   No results found for this basename: CKTOTAL, CKMB, CKMBINDEX, TROPONINI,  in the last 72 hours No results found for this basename: TSH, T4TOTAL, FREET3, T3FREE,  THYROIDAB,  in the last 72 hours No results found for this basename: VITAMINB12, FOLATE, FERRITIN, TIBC, IRON, RETICCTPCT,  in the last 72 hours Lab Results  Component Value Date   INR 1.74* 04/22/2010   INR 1.59* 04/21/2010   INR 1.62* 04/20/2010       Discharge instructions:  06-Home-Health Care Svc    Disposition: to SNF  Follow-up Appts: [facility] 3 weeks - ie after d/c from SNF.  Call for appointment.  Condition on Discharge: better than admission  Tests Needing Follow-up: Follow meds and labs  Time spent in discharge (includes decision making & examination of pt): 40 min  Signed: Breanah Faddis M 11/12/2013, 8:09 AM

## 2013-11-16 ENCOUNTER — Encounter: Payer: Self-pay | Admitting: Internal Medicine

## 2013-11-16 ENCOUNTER — Non-Acute Institutional Stay: Payer: Medicare Other | Admitting: Internal Medicine

## 2013-11-16 DIAGNOSIS — I4891 Unspecified atrial fibrillation: Secondary | ICD-10-CM

## 2013-11-16 DIAGNOSIS — M793 Panniculitis, unspecified: Secondary | ICD-10-CM

## 2013-11-16 DIAGNOSIS — L02419 Cutaneous abscess of limb, unspecified: Secondary | ICD-10-CM

## 2013-11-16 DIAGNOSIS — L03119 Cellulitis of unspecified part of limb: Secondary | ICD-10-CM

## 2013-11-16 DIAGNOSIS — J189 Pneumonia, unspecified organism: Secondary | ICD-10-CM

## 2013-11-16 DIAGNOSIS — R410 Disorientation, unspecified: Secondary | ICD-10-CM | POA: Insufficient documentation

## 2013-11-16 DIAGNOSIS — I2781 Cor pulmonale (chronic): Secondary | ICD-10-CM

## 2013-11-16 DIAGNOSIS — I5030 Unspecified diastolic (congestive) heart failure: Secondary | ICD-10-CM

## 2013-11-16 NOTE — Assessment & Plan Note (Signed)
This was always a soft call as I believe her primary issue was the LE Cellulitis. She has minimal cough. Last Repeat CXR showed - Cardiomegaly and asymmetric airspace filling, right greater than left. Considerations include pneumonia or asymmetric pulmonary edema

## 2013-11-16 NOTE — Assessment & Plan Note (Signed)
-   now lots better, finish PO Abx after 10 days on Saturday - Finish Doxy. She is now mentally fine and moving well enough for D/c - however she is very weak and will need rehab from here. She will get rehab at [facility]. Wound care saw pt 9/23 and recommended: Bactroban to promote healing to partial thickness leg wounds. Protect with foam dressings to absorb drainage. Eucerin cream to provide moist healing to skin surrounding wounds on bilat legs. Aquacel to absorb drainage and provide antimicrobial benefits to left foot wound

## 2013-11-16 NOTE — Assessment & Plan Note (Signed)
-   now lots better, finish PO Abx after 10 days on Saturday - Finish Doxy. She is now mentally fine and moving well enough for D/c - however she is very weak and will need rehab from here. She will get rehab at SNF. Wound care saw pt 9/23 and recommended: Bactroban to promote healing to partial thickness leg wounds. Protect with foam dressings to absorb drainage. Eucerin cream to provide moist healing to skin surrounding wounds on bilat legs. Aquacel to absorb drainage and provide antimicrobial benefits to left foot wound  

## 2013-11-16 NOTE — Progress Notes (Signed)
MRN: [id number] Name: Joy Benitez  Sex: female Age: 68 y.o. DOB: [birth year]  PSC #: [facility] Facility/Room: 210 Level Of Care: SNF Provider: Merrilee Seashore D Emergency Contacts: Extended Emergency Contact Information Primary Emergency Contact: Lefeber,Ronald Address: [address]          [place], [place] [address] [place] Home Phone: [phone] Mobile Phone: [phone] Relation: Spouse     Allergies: Levaquin  Chief Complaint  Patient presents with  . New Admit To SNF    HPI: Patient is 68 y.o. female who is admitted to SNF for OT/PT after acute panniculitis.  Past Medical History  Diagnosis Date  . Atrial fibrillation   . Hypertension   . History of blood transfusion 1975    "w/my daughter's birth"  . CHF (congestive heart failure)   . Type II diabetes mellitus   . Anemia   . Anxiety   . Arthritis     "hips down" (11/06/2013)  . Diabetic peripheral neuropathy     Past Surgical History  Procedure Laterality Date  . Cholecystectomy  1999  . Incision and drainage abscess  2002    "in my groin"  . Cesarean section  1975; 1985  . Cataract extraction w/ intraocular lens implant Left 2013      Medication List       This list is accurate as of: 11/16/13  9:16 PM.  Always use your most recent med list.               acetaminophen 325 MG tablet  Commonly known as:  TYLENOL  Take 2 tablets (650 mg total) by mouth every 6 (six) hours as needed for mild pain, moderate pain or fever.     albuterol (2.5 MG/3ML) 0.083% nebulizer solution  Commonly known as:  PROVENTIL  Inhale 3 mLs into the lungs every 6 (six) hours as needed for wheezing or shortness of breath.     ALPRAZolam 1 MG tablet  Commonly known as:  XANAX  Take 0.5 tablets (0.5 mg total) by mouth 2 (two) times daily as needed for anxiety.     CARTIA XT 120 MG 24 hr capsule  Generic drug:  diltiazem  Take 120 mg by mouth daily.     DIGOX 0.125 MG tablet  Generic drug:   digoxin  Take 0.0625 mg by mouth daily.     doxycycline 100 MG capsule  Commonly known as:  VIBRAMYCIN  Take 100 mg by mouth 2 (two) times daily.     feeding supplement (GLUCERNA SHAKE) Liqd  Take 237 mLs by mouth 2 (two) times daily after a meal.     feeding supplement (PRO-STAT SUGAR FREE 64) Liqd  Take 30 mLs by mouth 3 (three) times daily with meals.     furosemide 20 MG tablet  Commonly known as:  LASIX  Take 60 mg by mouth 2 (two) times daily.     gabapentin 300 MG capsule  Commonly known as:  NEURONTIN  Take 600 mg by mouth 2 (two) times daily.     hydrocerin Crea  Apply 1 application topically daily.     HYDROcodone-acetaminophen 5-325 MG per tablet  Commonly known as:  NORCO/VICODIN  Take 1 tablet by mouth every 8 (eight) hours as needed for moderate pain.     insulin aspart 100 UNIT/ML injection  Commonly known as:  novoLOG  - CBG 70 - 120: 0 units  - CBG 121 - 150: 2 units  - CBG 151 - 200:  3 units  - CBG 201 - 250: 5 units  - CBG 251 - 300: 8 units  - CBG 301 - 350: 11 units  - CBG 351 - 400: 15 units  - CBG > 400: 20 units and call MD     insulin glargine 100 UNIT/ML injection  Commonly known as:  LANTUS  Inject 0.18 mLs (18 Units total) into the skin at bedtime.     metFORMIN 500 MG tablet  Commonly known as:  GLUCOPHAGE  Take 500 mg by mouth 2 (two) times daily.     multivitamin with minerals Tabs tablet  Take 1 tablet by mouth daily.     mupirocin cream 2 %  Commonly known as:  BACTROBAN  Apply topically 2 (two) times daily.     nicotine 7 mg/24hr patch  Commonly known as:  NICODERM CQ - dosed in mg/24 hr  Place 1 patch (7 mg total) onto the skin daily.     polyethylene glycol packet  Commonly known as:  MIRALAX / GLYCOLAX  Take 17 g by mouth daily.     sorbitol 70 % Soln  Take 30 mLs by mouth daily as needed for moderate constipation.     spironolactone 25 MG tablet  Commonly known as:  ALDACTONE  Take 25 mg by mouth daily.         Meds ordered this encounter  Medications  . doxycycline (VIBRAMYCIN) 100 MG capsule    Sig: Take 100 mg by mouth 2 (two) times daily.    Immunization History  Administered Date(s) Administered  . Influenza,inj,Quad PF,36+ Mos 11/06/2013  . Pneumococcal Polysaccharide-23 11/07/2013    History  Substance Use Topics  . Smoking status: Current Every Day Smoker -- 0.25 packs/day for 48 years    Types: Cigarettes  . Smokeless tobacco: Never Used  . Alcohol Use: No    Family history is noncontributory    Review of Systems  DATA OBTAINED: from patient; i don't think I need to be here GENERAL:  no fevers, fatigue, appetite changes SKIN: No itching, rash  EYES: No eye pain, redness, discharge EARS: No earache, tinnitus, change in hearing NOSE: No congestion, drainage or bleeding  MOUTH/THROAT: No mouth or tooth pain, No sore throat RESPIRATORY: No cough, wheezing, SOB CARDIAC: No chest pain, palpitations, lower extremity edema  GI: No abdominal pain, No N/V/D or constipation, No heartburn or reflux  GU: No dysuria, frequency or urgency, or incontinence  MUSCULOSKELETAL: c/o B knee [pain NEUROLOGIC: No headache, dizziness or focal weakness PSYCHIATRIC: No overt anxiety or sadness, No behavior issue.   Filed Vitals:   11/16/13 2000  BP: 116/63  Pulse: 87  Temp: 98.2 F (36.8 C)  Resp: 20    Physical Exam  GENERAL APPEARANCE: Alert, conversant,  No acute distress;hugely obese WF  SKIN: No diaphoresis; excoriated rash BLE HEAD: Normocephalic, atraumatic  EYES: Conjunctiva/lids clear. Pupils round, reactive. EOMs intact.  EARS: External exam WNL, canals clear. Hearing grossly normal.  NOSE: No deformity or discharge.  MOUTH/THROAT: Lips w/o lesions  RESPIRATORY: Breathing is even, unlabored. Lung sounds are clear   CARDIOVASCULAR: Heart RRR no murmurs, rubs or gallops. 1+ peripheral edema with B elaphantitis GASTROINTESTINAL: Abdomen is soft, non-tender, hugely  obese w/ normal bowel sounds. GENITOURINARY: Bladder non tender, not distended  MUSCULOSKELETAL: No abnormal joints or musculature NEUROLOGIC:  Cranial nerves 2-12 grossly intact. Moves all extremities  PSYCHIATRIC: Mood and affect appropriate to situation, no behavioral issues  Patient Active Problem List  Diagnosis Date Noted  . Acute delirium 11/16/2013  . Diastolic CHF with preserved left ventricular function, NYHA class 2 11/05/2013  . Obstructive chronic bronchitis without exacerbation 11/05/2013  . Smoker 11/05/2013  . Acute panniculitis 11/04/2013  . Atrial fibrillation with tachycardic ventricular rate 11/04/2013  . Community acquired pneumonia 11/04/2013  . Type II or unspecified type diabetes mellitus with neurological manifestations, uncontrolled 11/04/2013  . Polyneuropathy in diabetes(357.2) 11/04/2013  . Morbid obesity 11/04/2013  . Cellulitis and abscess of leg 11/04/2013  . Cor pulmonale, chronic 11/04/2013  . Pulmonary hypertension 11/04/2013  . Anemia 11/04/2013    CBC    Component Value Date/Time   WBC 9.1 11/10/2013 0430   RBC 4.00 11/10/2013 0430   RBC 4.05 11/04/2013 2304   HGB 10.5* 11/10/2013 0430   HCT 33.9* 11/10/2013 0430   PLT 303 11/10/2013 0430   MCV 84.8 11/10/2013 0430   LYMPHSABS 1.6 11/04/2013 1913   MONOABS 1.2* 11/04/2013 1913   EOSABS 0.1 11/04/2013 1913   BASOSABS 0.0 11/04/2013 1913    CMP     Component Value Date/Time   NA 136* 11/10/2013 0430   K 3.9 11/10/2013 0430   CL 98 11/10/2013 0430   CO2 23 11/10/2013 0430   GLUCOSE 161* 11/10/2013 0430   BUN 18 11/10/2013 0430   CREATININE 0.99 11/10/2013 0430   CALCIUM 8.7 11/10/2013 0430   PROT 7.2 11/09/2013 0404   ALBUMIN 2.8* 11/09/2013 0404   AST 13 11/09/2013 0404   ALT 7 11/09/2013 0404   ALKPHOS 91 11/09/2013 0404   BILITOT 2.0* 11/09/2013 0404   GFRNONAA 59* 11/10/2013 0430   GFRAA 68* 11/10/2013 0430    Assessment and Plan  Acute panniculitis - now lots better, finish PO Abx after 10  days on Saturday - Finish Doxy. She is now mentally fine and moving well enough for D/c - however she is very weak and will need rehab from here. She will get rehab at [facility]. Wound care saw pt 9/23 and recommended: Bactroban to promote healing to partial thickness leg wounds. Protect with foam dressings to absorb drainage. Eucerin cream to provide moist healing to skin surrounding wounds on bilat legs. Aquacel to absorb drainage and provide antimicrobial benefits to left foot wound   Cellulitis and abscess of leg - now lots better, finish PO Abx after 10 days on Saturday - Finish Doxy. She is now mentally fine and moving well enough for D/c - however she is very weak and will need rehab from here. She will get rehab at [facility]. Wound care saw pt 9/23 and recommended: Bactroban to promote healing to partial thickness leg wounds. Protect with foam dressings to absorb drainage. Eucerin cream to provide moist healing to skin surrounding wounds on bilat legs. Aquacel to absorb drainage and provide antimicrobial benefits to left foot wound   Acute delirium Haldol prn for safety was never needed. Delirium better today and mentally she is at baseline. She got better c changing the dose and way Xanax was given, decreasing Narcotics and adjusting Neurontin. I think it is safe to increase neurontin again   Community acquired pneumonia This was always a soft call as I believe her primary issue was the LE Cellulitis. She has minimal cough. Last Repeat CXR showed - Cardiomegaly and asymmetric airspace filling, right greater than left. Considerations include pneumonia or asymmetric pulmonary edema   Atrial fibrillation with tachycardic ventricular rate - Cardizem/Dig/lovenox. Dig level was fine. Cardizem dosing increased on admission when she had more  RVR. Heart rate is better and Dilt can be dropped back to the 120 dose so we have more room to maintain diuresis. She has refused Coumadin several times in the past as she  was on it for a little while and come off on her own. She has also refused the newer agents. She was on lovenox for DVT proph only. She had 3 (-) Trop I's on admit   Diastolic CHF with preserved left ventricular function, NYHA class 2 Pro-BNP was only 103 on admit. Diastolic/R Sided c recurring Anasarca EF still fine PA pressure 45, mild PR  Fluid status is clearly better than admission.  Lasix is now PO as some hypotension noted.  However she is tolerating  2d ECHO showed:  - Left ventricle: Abnormal septal motion. The cavity size was normal. Wall thickness was increased in a pattern of severe LVH. Systolic function was normal. The estimated ejection fraction was in the range of 50% to 55%. - Left atrium: The atrium was moderately dilated. - Right ventricle: The cavity size was moderately dilated. - Right atrium: The atrium was moderately dilated. - Atrial septum: No defect or patent foramen ovale was identified. - Pulmonary arteries: PA peak pressure: 43 mm Hg (S).    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 She has not wanted formal sleep study and CPA/BiPAP use   Morbid obesity Her recorded wts in house went up but clearly her edema went down. ?Validity of weights recorded while in bed? Current weight listed = 371. This is much higher than baseline. She does much better when weights are @ 320#s. On March 23rd I sent her for Ab [place] to R/Out ascites and something to tap as she was doing poor that day - It showed: Hepatomegaly with suspected hepatic steatosis.  Prior cholecystectomy.  No ascites is visualized.    Pt seen on 11/13/2013 Margit Hanks, MD

## 2013-11-16 NOTE — Assessment & Plan Note (Signed)
-   Cardizem/Dig/lovenox. Dig level was fine. Cardizem dosing increased on admission when she had more RVR. Heart rate is better and Dilt can be dropped back to the 120 dose so we have more room to maintain diuresis. She has refused Coumadin several times in the past as she was on it for a little while and come off on her own. She has also refused the newer agents. She was on lovenox for DVT proph only. She had 3 (-) Trop I's on admit

## 2013-11-16 NOTE — Assessment & Plan Note (Signed)
Her recorded wts in house went up but clearly her edema went down. ?Validity of weights recorded while in bed? Current weight listed = 371. This is much higher than baseline. She does much better when weights are @ 320#s. On March 23rd I sent her for Ab [place] to R/Out ascites and something to tap as she was doing poor that day - It showed: Hepatomegaly with suspected hepatic steatosis.  Prior cholecystectomy.  No ascites is visualized.

## 2013-11-16 NOTE — Assessment & Plan Note (Signed)
Haldol prn for safety was never needed. Delirium better today and mentally she is at baseline. She got better c changing the dose and way Xanax was given, decreasing Narcotics and adjusting Neurontin. I think it is safe to increase neurontin again

## 2013-11-16 NOTE — Assessment & Plan Note (Signed)
Pro-BNP was only 103 on admit. Diastolic/R Sided c recurring Anasarca EF still fine PA pressure 45, mild PR  Fluid status is clearly better than admission.  Lasix is now PO as some hypotension noted.  However she is tolerating  2d ECHO showed:  - Left ventricle: Abnormal septal motion. The cavity size was normal. Wall thickness was increased in a pattern of severe LVH. Systolic function was normal. The estimated ejection fraction was in the range of 50% to 55%. - Left atrium: The atrium was moderately dilated. - Right ventricle: The cavity size was moderately dilated. - Right atrium: The atrium was moderately dilated. - Atrial septum: No defect or patent foramen ovale was identified. - Pulmonary arteries: PA peak pressure: 43 mm Hg (S).

## 2013-11-16 NOTE — Assessment & Plan Note (Signed)
from Obesity Hypoventilation and COPD - Needs to do better job at caring for self. She has not wanted formal sleep study and CPA/BiPAP use

## 2013-11-20 ENCOUNTER — Encounter (HOSPITAL_COMMUNITY): Payer: Self-pay | Admitting: Internal Medicine

## 2013-11-24 ENCOUNTER — Encounter: Payer: Self-pay | Admitting: Internal Medicine

## 2013-11-24 ENCOUNTER — Non-Acute Institutional Stay (SKILLED_NURSING_FACILITY): Payer: Medicare Other | Admitting: Internal Medicine

## 2013-11-24 DIAGNOSIS — I5030 Unspecified diastolic (congestive) heart failure: Secondary | ICD-10-CM

## 2013-11-24 DIAGNOSIS — L03119 Cellulitis of unspecified part of limb: Secondary | ICD-10-CM

## 2013-11-24 DIAGNOSIS — L02419 Cutaneous abscess of limb, unspecified: Secondary | ICD-10-CM

## 2013-11-24 DIAGNOSIS — M793 Panniculitis, unspecified: Secondary | ICD-10-CM

## 2013-11-24 DIAGNOSIS — E1165 Type 2 diabetes mellitus with hyperglycemia: Secondary | ICD-10-CM

## 2013-11-24 DIAGNOSIS — E1140 Type 2 diabetes mellitus with diabetic neuropathy, unspecified: Secondary | ICD-10-CM

## 2013-11-24 DIAGNOSIS — IMO0002 Reserved for concepts with insufficient information to code with codable children: Secondary | ICD-10-CM

## 2013-11-24 DIAGNOSIS — J449 Chronic obstructive pulmonary disease, unspecified: Secondary | ICD-10-CM

## 2013-11-24 DIAGNOSIS — I2781 Cor pulmonale (chronic): Secondary | ICD-10-CM

## 2013-11-24 NOTE — Progress Notes (Signed)
MRN: [id number] Name: Joy Benitez  Sex: female Age: 68 y.o. DOB: [birth year]  PSC #: [facility] Facility/Room: 210 Level Of Care: SNF Provider: Merrilee Seashore D Emergency Contacts: Extended Emergency Contact Information Primary Emergency Contact: Rodas,Ronald Address: [address]          [place], [place] [address] [place] Home Phone: [phone] Mobile Phone: [phone] Relation: Spouse  Code Status: FULL  Allergies: Levaquin  Chief Complaint  Patient presents with  . Discharge Note    HPI: Patient is 68 y.o. female who was admitted s/p panniculitis tx in hospital who is ready to ne d/c to home.  Past Medical History  Diagnosis Date  . Atrial fibrillation   . Hypertension   . History of blood transfusion 1975    "w/my daughter's birth"  . CHF (congestive heart failure)   . Type II diabetes mellitus   . Anemia   . Anxiety   . Arthritis     "hips down" (11/06/2013)  . Diabetic peripheral neuropathy     Past Surgical History  Procedure Laterality Date  . Cholecystectomy  1999  . Incision and drainage abscess  2002    "in my groin"  . Cesarean section  1975; 1985  . Cataract extraction w/ intraocular lens implant Left 2013      Medication List       This list is accurate as of: 11/24/13 11:59 PM.  Always use your most recent med list.               acetaminophen 325 MG tablet  Commonly known as:  TYLENOL  Take 2 tablets (650 mg total) by mouth every 6 (six) hours as needed for mild pain, moderate pain or fever.     albuterol (2.5 MG/3ML) 0.083% nebulizer solution  Commonly known as:  PROVENTIL  Inhale 3 mLs into the lungs every 6 (six) hours as needed for wheezing or shortness of breath.     ALPRAZolam 1 MG tablet  Commonly known as:  XANAX  Take 0.5 tablets (0.5 mg total) by mouth 2 (two) times daily as needed for anxiety.     CARTIA XT 120 MG 24 hr capsule  Generic drug:  diltiazem  Take 120 mg by mouth daily.     DIGOX 0.125 MG tablet  Generic drug:  digoxin  Take 0.0625 mg by mouth daily.     doxycycline 100 MG capsule  Commonly known as:  VIBRAMYCIN  Take 100 mg by mouth 2 (two) times daily.     feeding supplement (GLUCERNA SHAKE) Liqd  Take 237 mLs by mouth 2 (two) times daily after a meal.     feeding supplement (PRO-STAT SUGAR FREE 64) Liqd  Take 30 mLs by mouth 3 (three) times daily with meals.     furosemide 20 MG tablet  Commonly known as:  LASIX  Take 60 mg by mouth 2 (two) times daily.     gabapentin 300 MG capsule  Commonly known as:  NEURONTIN  Take 600 mg by mouth 2 (two) times daily.     hydrocerin Crea  Apply 1 application topically daily.     HYDROcodone-acetaminophen 5-325 MG per tablet  Commonly known as:  NORCO/VICODIN  Take 1 tablet by mouth every 8 (eight) hours as needed for moderate pain.     insulin aspart 100 UNIT/ML injection  Commonly known as:  novoLOG  - CBG 70 - 120: 0 units  - CBG 121 - 150: 2 units  - CBG  151 - 200: 3 units  - CBG 201 - 250: 5 units  - CBG 251 - 300: 8 units  - CBG 301 - 350: 11 units  - CBG 351 - 400: 15 units  - CBG > 400: 20 units and call MD     insulin glargine 100 UNIT/ML injection  Commonly known as:  LANTUS  Inject 0.18 mLs (18 Units total) into the skin at bedtime.     metFORMIN 500 MG tablet  Commonly known as:  GLUCOPHAGE  Take 500 mg by mouth 2 (two) times daily.     multivitamin with minerals Tabs tablet  Take 1 tablet by mouth daily.     mupirocin cream 2 %  Commonly known as:  BACTROBAN  Apply topically 2 (two) times daily.     nicotine 7 mg/24hr patch  Commonly known as:  NICODERM CQ - dosed in mg/24 hr  Place 1 patch (7 mg total) onto the skin daily.     polyethylene glycol packet  Commonly known as:  MIRALAX / GLYCOLAX  Take 17 g by mouth daily.     sorbitol 70 % Soln  Take 30 mLs by mouth daily as needed for moderate constipation.     spironolactone 25 MG tablet  Commonly known as:   ALDACTONE  Take 25 mg by mouth daily.        No orders of the defined types were placed in this encounter.    Immunization History  Administered Date(s) Administered  . Influenza,inj,Quad PF,36+ Mos 11/06/2013  . Pneumococcal Polysaccharide-23 11/07/2013    History  Substance Use Topics  . Smoking status: Current Every Day Smoker -- 0.25 packs/day for 48 years    Types: Cigarettes  . Smokeless tobacco: Never Used  . Alcohol Use: No    Filed Vitals:   11/24/13 1545  BP: 112/76  Pulse: 76  Temp: 97.9 F (36.6 C)  Resp: 20    Physical Exam  GENERAL APPEARANCE: Alert, conversant. No acute distress;massively obese WF  HEENT: Unremarkable. RESPIRATORY: Breathing is even, unlabored. Lung sounds are clear   CARDIOVASCULAR: Heart RRR no murmurs, rubs or gallops. 1+ peripheral edema.  GASTROINTESTINAL: Abdomen is soft, non-tender, not distended w/ normal bowel sounds.  NEUROLOGIC: Cranial nerves 2-12 grossly intact. Moves all extremities  Patient Active Problem List   Diagnosis Date Noted  . Acute delirium 11/16/2013  . Diastolic CHF with preserved left ventricular function, NYHA class 2 11/05/2013  . Obstructive chronic bronchitis without exacerbation 11/05/2013  . Smoker 11/05/2013  . Acute panniculitis 11/04/2013  . Atrial fibrillation with tachycardic ventricular rate 11/04/2013  . Community acquired pneumonia 11/04/2013  . Type 2 diabetes, uncontrolled, with neuropathy 11/04/2013  . Polyneuropathy in diabetes(357.2) 11/04/2013  . Morbid obesity 11/04/2013  . Cellulitis and abscess of leg 11/04/2013  . Cor pulmonale, chronic 11/04/2013  . Pulmonary hypertension 11/04/2013  . Anemia 11/04/2013    CBC    Component Value Date/Time   WBC 9.1 11/10/2013 0430   RBC 4.00 11/10/2013 0430   RBC 4.05 11/04/2013 2304   HGB 10.5* 11/10/2013 0430   HCT 33.9* 11/10/2013 0430   PLT 303 11/10/2013 0430   MCV 84.8 11/10/2013 0430   LYMPHSABS 1.6 11/04/2013 1913   MONOABS 1.2*  11/04/2013 1913   EOSABS 0.1 11/04/2013 1913   BASOSABS 0.0 11/04/2013 1913    CMP     Component Value Date/Time   NA 136* 11/10/2013 0430   K 3.9 11/10/2013 0430   CL 98 11/10/2013  0430   CO2 23 11/10/2013 0430   GLUCOSE 161* 11/10/2013 0430   BUN 18 11/10/2013 0430   CREATININE 0.99 11/10/2013 0430   CALCIUM 8.7 11/10/2013 0430   PROT 7.2 11/09/2013 0404   ALBUMIN 2.8* 11/09/2013 0404   AST 13 11/09/2013 0404   ALT 7 11/09/2013 0404   ALKPHOS 91 11/09/2013 0404   BILITOT 2.0* 11/09/2013 0404   GFRNONAA 59* 11/10/2013 0430   GFRAA 68* 11/10/2013 0430    Assessment and Plan  Pt is being d/c to home in stable condition with HH/OT/PT/nursing for wound care.  Margit Hanks, MD

## 2013-11-26 ENCOUNTER — Encounter: Payer: Self-pay | Admitting: Internal Medicine

## 2013-12-05 ENCOUNTER — Telehealth: Payer: Self-pay

## 2013-12-05 NOTE — Telephone Encounter (Signed)
Home Health Therapist called to give FYI to Dr.Alexander:  Patient refused any additional therapy- patient is at home after nursing home discharge and indicates she is doing better  Therapist was informed any additional update will need to be made to patient's PCP- No longer under Dr.Alexander's care.

## 2014-02-14 ENCOUNTER — Emergency Department (HOSPITAL_COMMUNITY): Payer: Medicare Other

## 2014-02-14 ENCOUNTER — Inpatient Hospital Stay (HOSPITAL_COMMUNITY)
Admission: EM | Admit: 2014-02-14 | Discharge: 2014-03-16 | DRG: 871 | Disposition: E | Payer: Medicare Other | Attending: Internal Medicine | Admitting: Internal Medicine

## 2014-02-14 ENCOUNTER — Encounter (HOSPITAL_COMMUNITY): Payer: Self-pay | Admitting: Vascular Surgery

## 2014-02-14 DIAGNOSIS — J449 Chronic obstructive pulmonary disease, unspecified: Secondary | ICD-10-CM

## 2014-02-14 DIAGNOSIS — R0602 Shortness of breath: Secondary | ICD-10-CM | POA: Diagnosis present

## 2014-02-14 DIAGNOSIS — M5136 Other intervertebral disc degeneration, lumbar region: Secondary | ICD-10-CM | POA: Diagnosis present

## 2014-02-14 DIAGNOSIS — I482 Chronic atrial fibrillation: Secondary | ICD-10-CM | POA: Diagnosis present

## 2014-02-14 DIAGNOSIS — B369 Superficial mycosis, unspecified: Secondary | ICD-10-CM | POA: Diagnosis present

## 2014-02-14 DIAGNOSIS — E46 Unspecified protein-calorie malnutrition: Secondary | ICD-10-CM | POA: Diagnosis present

## 2014-02-14 DIAGNOSIS — N189 Chronic kidney disease, unspecified: Secondary | ICD-10-CM | POA: Diagnosis present

## 2014-02-14 DIAGNOSIS — R627 Adult failure to thrive: Secondary | ICD-10-CM | POA: Diagnosis present

## 2014-02-14 DIAGNOSIS — I10 Essential (primary) hypertension: Secondary | ICD-10-CM | POA: Diagnosis present

## 2014-02-14 DIAGNOSIS — I2510 Atherosclerotic heart disease of native coronary artery without angina pectoris: Secondary | ICD-10-CM | POA: Diagnosis present

## 2014-02-14 DIAGNOSIS — R451 Restlessness and agitation: Secondary | ICD-10-CM | POA: Diagnosis not present

## 2014-02-14 DIAGNOSIS — R509 Fever, unspecified: Secondary | ICD-10-CM

## 2014-02-14 DIAGNOSIS — D649 Anemia, unspecified: Secondary | ICD-10-CM | POA: Diagnosis present

## 2014-02-14 DIAGNOSIS — N390 Urinary tract infection, site not specified: Secondary | ICD-10-CM | POA: Diagnosis present

## 2014-02-14 DIAGNOSIS — F17210 Nicotine dependence, cigarettes, uncomplicated: Secondary | ICD-10-CM | POA: Diagnosis present

## 2014-02-14 DIAGNOSIS — I270 Primary pulmonary hypertension: Secondary | ICD-10-CM

## 2014-02-14 DIAGNOSIS — M1990 Unspecified osteoarthritis, unspecified site: Secondary | ICD-10-CM | POA: Diagnosis present

## 2014-02-14 DIAGNOSIS — I2781 Cor pulmonale (chronic): Secondary | ICD-10-CM | POA: Diagnosis present

## 2014-02-14 DIAGNOSIS — Z66 Do not resuscitate: Secondary | ICD-10-CM | POA: Diagnosis present

## 2014-02-14 DIAGNOSIS — I5043 Acute on chronic combined systolic (congestive) and diastolic (congestive) heart failure: Secondary | ICD-10-CM | POA: Diagnosis present

## 2014-02-14 DIAGNOSIS — N179 Acute kidney failure, unspecified: Secondary | ICD-10-CM | POA: Diagnosis present

## 2014-02-14 DIAGNOSIS — I214 Non-ST elevation (NSTEMI) myocardial infarction: Secondary | ICD-10-CM | POA: Diagnosis present

## 2014-02-14 DIAGNOSIS — E662 Morbid (severe) obesity with alveolar hypoventilation: Secondary | ICD-10-CM | POA: Diagnosis present

## 2014-02-14 DIAGNOSIS — J438 Other emphysema: Secondary | ICD-10-CM

## 2014-02-14 DIAGNOSIS — I4891 Unspecified atrial fibrillation: Secondary | ICD-10-CM | POA: Diagnosis present

## 2014-02-14 DIAGNOSIS — Z6841 Body Mass Index (BMI) 40.0 and over, adult: Secondary | ICD-10-CM

## 2014-02-14 DIAGNOSIS — I272 Other secondary pulmonary hypertension: Secondary | ICD-10-CM | POA: Diagnosis present

## 2014-02-14 DIAGNOSIS — L02419 Cutaneous abscess of limb, unspecified: Secondary | ICD-10-CM | POA: Diagnosis present

## 2014-02-14 DIAGNOSIS — Z720 Tobacco use: Secondary | ICD-10-CM

## 2014-02-14 DIAGNOSIS — I878 Other specified disorders of veins: Secondary | ICD-10-CM | POA: Diagnosis present

## 2014-02-14 DIAGNOSIS — E1149 Type 2 diabetes mellitus with other diabetic neurological complication: Secondary | ICD-10-CM | POA: Diagnosis present

## 2014-02-14 DIAGNOSIS — Z515 Encounter for palliative care: Secondary | ICD-10-CM

## 2014-02-14 DIAGNOSIS — I5030 Unspecified diastolic (congestive) heart failure: Secondary | ICD-10-CM | POA: Diagnosis present

## 2014-02-14 DIAGNOSIS — L03116 Cellulitis of left lower limb: Secondary | ICD-10-CM | POA: Diagnosis present

## 2014-02-14 DIAGNOSIS — E11621 Type 2 diabetes mellitus with foot ulcer: Secondary | ICD-10-CM

## 2014-02-14 DIAGNOSIS — B964 Proteus (mirabilis) (morganii) as the cause of diseases classified elsewhere: Secondary | ICD-10-CM | POA: Diagnosis present

## 2014-02-14 DIAGNOSIS — A408 Other streptococcal sepsis: Principal | ICD-10-CM | POA: Diagnosis present

## 2014-02-14 DIAGNOSIS — E1142 Type 2 diabetes mellitus with diabetic polyneuropathy: Secondary | ICD-10-CM | POA: Diagnosis present

## 2014-02-14 DIAGNOSIS — I9589 Other hypotension: Secondary | ICD-10-CM

## 2014-02-14 DIAGNOSIS — I219 Acute myocardial infarction, unspecified: Secondary | ICD-10-CM | POA: Insufficient documentation

## 2014-02-14 DIAGNOSIS — IMO0002 Reserved for concepts with insufficient information to code with codable children: Secondary | ICD-10-CM | POA: Diagnosis present

## 2014-02-14 DIAGNOSIS — E860 Dehydration: Secondary | ICD-10-CM | POA: Diagnosis present

## 2014-02-14 DIAGNOSIS — R6521 Severe sepsis with septic shock: Secondary | ICD-10-CM | POA: Diagnosis present

## 2014-02-14 DIAGNOSIS — R531 Weakness: Secondary | ICD-10-CM

## 2014-02-14 DIAGNOSIS — A419 Sepsis, unspecified organism: Secondary | ICD-10-CM

## 2014-02-14 DIAGNOSIS — L97529 Non-pressure chronic ulcer of other part of left foot with unspecified severity: Secondary | ICD-10-CM | POA: Diagnosis present

## 2014-02-14 DIAGNOSIS — L97509 Non-pressure chronic ulcer of other part of unspecified foot with unspecified severity: Secondary | ICD-10-CM

## 2014-02-14 DIAGNOSIS — R7989 Other specified abnormal findings of blood chemistry: Secondary | ICD-10-CM

## 2014-02-14 DIAGNOSIS — E872 Acidosis: Secondary | ICD-10-CM | POA: Diagnosis present

## 2014-02-14 DIAGNOSIS — F411 Generalized anxiety disorder: Secondary | ICD-10-CM | POA: Diagnosis present

## 2014-02-14 DIAGNOSIS — E1140 Type 2 diabetes mellitus with diabetic neuropathy, unspecified: Secondary | ICD-10-CM | POA: Diagnosis present

## 2014-02-14 DIAGNOSIS — L0390 Cellulitis, unspecified: Secondary | ICD-10-CM | POA: Insufficient documentation

## 2014-02-14 DIAGNOSIS — L03119 Cellulitis of unspecified part of limb: Secondary | ICD-10-CM

## 2014-02-14 DIAGNOSIS — R6520 Severe sepsis without septic shock: Secondary | ICD-10-CM

## 2014-02-14 DIAGNOSIS — E1165 Type 2 diabetes mellitus with hyperglycemia: Secondary | ICD-10-CM | POA: Diagnosis present

## 2014-02-14 DIAGNOSIS — B954 Other streptococcus as the cause of diseases classified elsewhere: Secondary | ICD-10-CM

## 2014-02-14 DIAGNOSIS — I872 Venous insufficiency (chronic) (peripheral): Secondary | ICD-10-CM | POA: Diagnosis present

## 2014-02-14 DIAGNOSIS — I248 Other forms of acute ischemic heart disease: Secondary | ICD-10-CM

## 2014-02-14 DIAGNOSIS — F17200 Nicotine dependence, unspecified, uncomplicated: Secondary | ICD-10-CM | POA: Diagnosis present

## 2014-02-14 DIAGNOSIS — I739 Peripheral vascular disease, unspecified: Secondary | ICD-10-CM

## 2014-02-14 DIAGNOSIS — J9602 Acute respiratory failure with hypercapnia: Secondary | ICD-10-CM | POA: Diagnosis not present

## 2014-02-14 DIAGNOSIS — R778 Other specified abnormalities of plasma proteins: Secondary | ICD-10-CM

## 2014-02-14 DIAGNOSIS — Z794 Long term (current) use of insulin: Secondary | ICD-10-CM | POA: Diagnosis not present

## 2014-02-14 DIAGNOSIS — I5031 Acute diastolic (congestive) heart failure: Secondary | ICD-10-CM

## 2014-02-14 HISTORY — DX: Chronic diastolic (congestive) heart failure: I50.32

## 2014-02-14 LAB — I-STAT CG4 LACTIC ACID, ED: Lactic Acid, Venous: 5.21 mmol/L — ABNORMAL HIGH (ref 0.5–2.2)

## 2014-02-14 LAB — CBG MONITORING, ED: GLUCOSE-CAPILLARY: 252 mg/dL — AB (ref 70–99)

## 2014-02-14 LAB — COMPREHENSIVE METABOLIC PANEL
ALK PHOS: 101 U/L (ref 39–117)
ALT: 11 U/L (ref 0–35)
ANION GAP: 15 (ref 5–15)
AST: 44 U/L — ABNORMAL HIGH (ref 0–37)
Albumin: 2.9 g/dL — ABNORMAL LOW (ref 3.5–5.2)
BILIRUBIN TOTAL: 2.5 mg/dL — AB (ref 0.3–1.2)
BUN: 35 mg/dL — AB (ref 6–23)
CHLORIDE: 99 meq/L (ref 96–112)
CO2: 20 mmol/L (ref 19–32)
Calcium: 8.7 mg/dL (ref 8.4–10.5)
Creatinine, Ser: 1.8 mg/dL — ABNORMAL HIGH (ref 0.50–1.10)
GFR calc non Af Amer: 28 mL/min — ABNORMAL LOW (ref >90)
GFR, EST AFRICAN AMERICAN: 33 mL/min — AB (ref >90)
GLUCOSE: 260 mg/dL — AB (ref 70–99)
POTASSIUM: 4.8 mmol/L (ref 3.5–5.1)
SODIUM: 134 mmol/L — AB (ref 135–145)
Total Protein: 8 g/dL (ref 6.0–8.3)

## 2014-02-14 LAB — URINALYSIS, ROUTINE W REFLEX MICROSCOPIC
GLUCOSE, UA: NEGATIVE mg/dL
Hgb urine dipstick: NEGATIVE
Ketones, ur: NEGATIVE mg/dL
NITRITE: POSITIVE — AB
PH: 6 (ref 5.0–8.0)
Protein, ur: NEGATIVE mg/dL
SPECIFIC GRAVITY, URINE: 1.018 (ref 1.005–1.030)
Urobilinogen, UA: 1 mg/dL (ref 0.0–1.0)

## 2014-02-14 LAB — CBC WITH DIFFERENTIAL/PLATELET
BASOS PCT: 0 % (ref 0–1)
Basophils Absolute: 0 10*3/uL (ref 0.0–0.1)
EOS PCT: 0 % (ref 0–5)
Eosinophils Absolute: 0 10*3/uL (ref 0.0–0.7)
HCT: 39.2 % (ref 36.0–46.0)
HEMOGLOBIN: 12.3 g/dL (ref 12.0–15.0)
LYMPHS ABS: 1.9 10*3/uL (ref 0.7–4.0)
LYMPHS PCT: 7 % — AB (ref 12–46)
MCH: 26.9 pg (ref 26.0–34.0)
MCHC: 31.4 g/dL (ref 30.0–36.0)
MCV: 85.6 fL (ref 78.0–100.0)
MONOS PCT: 8 % (ref 3–12)
Monocytes Absolute: 2.1 10*3/uL — ABNORMAL HIGH (ref 0.1–1.0)
NEUTROS ABS: 22.6 10*3/uL — AB (ref 1.7–7.7)
Neutrophils Relative %: 85 % — ABNORMAL HIGH (ref 43–77)
Platelets: 361 10*3/uL (ref 150–400)
RBC: 4.58 MIL/uL (ref 3.87–5.11)
RDW: 19.4 % — ABNORMAL HIGH (ref 11.5–15.5)
WBC Morphology: INCREASED
WBC: 26.6 10*3/uL — AB (ref 4.0–10.5)

## 2014-02-14 LAB — TROPONIN I
TROPONIN I: 7.34 ng/mL — AB (ref <0.031)
Troponin I: 7.63 ng/mL (ref <0.031)

## 2014-02-14 LAB — CK: CK TOTAL: 355 U/L — AB (ref 7–177)

## 2014-02-14 LAB — HEPARIN LEVEL (UNFRACTIONATED): Heparin Unfractionated: 0.16 IU/mL — ABNORMAL LOW (ref 0.30–0.70)

## 2014-02-14 LAB — URINE MICROSCOPIC-ADD ON

## 2014-02-14 LAB — DIGOXIN LEVEL: DIGOXIN LVL: 0.5 ng/mL — AB (ref 0.8–2.0)

## 2014-02-14 LAB — I-STAT TROPONIN, ED: Troponin i, poc: 5.54 ng/mL (ref 0.00–0.08)

## 2014-02-14 LAB — MRSA PCR SCREENING: MRSA BY PCR: NEGATIVE

## 2014-02-14 LAB — BRAIN NATRIURETIC PEPTIDE: B Natriuretic Peptide: 1237.6 pg/mL — ABNORMAL HIGH (ref 0.0–100.0)

## 2014-02-14 LAB — GLUCOSE, CAPILLARY
GLUCOSE-CAPILLARY: 218 mg/dL — AB (ref 70–99)
Glucose-Capillary: 210 mg/dL — ABNORMAL HIGH (ref 70–99)

## 2014-02-14 MED ORDER — INSULIN GLARGINE 100 UNIT/ML ~~LOC~~ SOLN
10.0000 [IU] | Freq: Every day | SUBCUTANEOUS | Status: DC
  Administered 2014-02-14: 10 [IU] via SUBCUTANEOUS
  Filled 2014-02-14 (×2): qty 0.1

## 2014-02-14 MED ORDER — INSULIN ASPART 100 UNIT/ML ~~LOC~~ SOLN
0.0000 [IU] | Freq: Three times a day (TID) | SUBCUTANEOUS | Status: DC
  Administered 2014-02-14: 5 [IU] via SUBCUTANEOUS
  Administered 2014-02-15: 2 [IU] via SUBCUTANEOUS
  Administered 2014-02-15: 3 [IU] via SUBCUTANEOUS
  Administered 2014-02-15 – 2014-02-17 (×6): 2 [IU] via SUBCUTANEOUS
  Administered 2014-02-17: 3 [IU] via SUBCUTANEOUS

## 2014-02-14 MED ORDER — GABAPENTIN 300 MG PO CAPS
600.0000 mg | ORAL_CAPSULE | Freq: Two times a day (BID) | ORAL | Status: DC
Start: 2014-02-14 — End: 2014-02-21
  Administered 2014-02-14 – 2014-02-18 (×9): 600 mg via ORAL
  Filled 2014-02-14 (×15): qty 2

## 2014-02-14 MED ORDER — ALPRAZOLAM 0.5 MG PO TABS
0.5000 mg | ORAL_TABLET | Freq: Two times a day (BID) | ORAL | Status: DC | PRN
  Administered 2014-02-16 – 2014-02-18 (×2): 0.5 mg via ORAL
  Filled 2014-02-14 (×2): qty 1

## 2014-02-14 MED ORDER — SODIUM CHLORIDE 0.9 % IV SOLN
INTRAVENOUS | Status: DC
  Administered 2014-02-14 – 2014-02-15 (×3): via INTRAVENOUS

## 2014-02-14 MED ORDER — VANCOMYCIN HCL 10 G IV SOLR
1250.0000 mg | Freq: Two times a day (BID) | INTRAVENOUS | Status: DC
  Administered 2014-02-15 – 2014-02-17 (×5): 1250 mg via INTRAVENOUS
  Filled 2014-02-14 (×6): qty 1250

## 2014-02-14 MED ORDER — ACETAMINOPHEN 325 MG PO TABS
650.0000 mg | ORAL_TABLET | Freq: Four times a day (QID) | ORAL | Status: DC | PRN
Start: 2014-02-14 — End: 2014-02-23

## 2014-02-14 MED ORDER — ACETAMINOPHEN 500 MG PO TABS
1000.0000 mg | ORAL_TABLET | Freq: Once | ORAL | Status: DC
Start: 2014-02-14 — End: 2014-02-14
  Filled 2014-02-14: qty 2

## 2014-02-14 MED ORDER — SENNA 8.6 MG PO TABS
1.0000 | ORAL_TABLET | Freq: Two times a day (BID) | ORAL | Status: DC
  Administered 2014-02-14 – 2014-02-18 (×8): 8.6 mg via ORAL
  Filled 2014-02-14 (×15): qty 1

## 2014-02-14 MED ORDER — HEPARIN BOLUS VIA INFUSION
3000.0000 [IU] | Freq: Once | INTRAVENOUS | Status: AC
  Administered 2014-02-14: 3000 [IU] via INTRAVENOUS
  Filled 2014-02-14: qty 3000

## 2014-02-14 MED ORDER — PIPERACILLIN-TAZOBACTAM 3.375 G IVPB 30 MIN
3.3750 g | Freq: Once | INTRAVENOUS | Status: AC
  Administered 2014-02-14: 3.375 g via INTRAVENOUS
  Filled 2014-02-14: qty 50

## 2014-02-14 MED ORDER — HYDROCERIN EX CREA
1.0000 "application " | TOPICAL_CREAM | Freq: Two times a day (BID) | CUTANEOUS | Status: DC
  Administered 2014-02-14 – 2014-02-22 (×16): 1 via TOPICAL
  Filled 2014-02-14: qty 113

## 2014-02-14 MED ORDER — HEPARIN (PORCINE) IN NACL 100-0.45 UNIT/ML-% IJ SOLN
1700.0000 [IU]/h | INTRAMUSCULAR | Status: DC
  Administered 2014-02-14: 1700 [IU]/h via INTRAVENOUS
  Administered 2014-02-14: 1450 [IU]/h via INTRAVENOUS
  Filled 2014-02-14 (×4): qty 250

## 2014-02-14 MED ORDER — PIPERACILLIN-TAZOBACTAM 3.375 G IVPB 30 MIN: 3.3750 g | Freq: Four times a day (QID) | INTRAVENOUS | Status: DC

## 2014-02-14 MED ORDER — ASPIRIN 325 MG PO TABS: 325.0000 mg | ORAL_TABLET | Freq: Once | ORAL | Status: DC

## 2014-02-14 MED ORDER — HYDROMORPHONE HCL 1 MG/ML IJ SOLN
1.0000 mg | Freq: Once | INTRAMUSCULAR | Status: AC
  Administered 2014-02-14: 1 mg via INTRAVENOUS
  Filled 2014-02-14: qty 1

## 2014-02-14 MED ORDER — SODIUM CHLORIDE 0.9 % IV BOLUS (SEPSIS)
1000.0000 mL | Freq: Once | INTRAVENOUS | Status: AC
  Administered 2014-02-14: 1000 mL via INTRAVENOUS

## 2014-02-14 MED ORDER — INSULIN ASPART 100 UNIT/ML ~~LOC~~ SOLN
0.0000 [IU] | Freq: Every day | SUBCUTANEOUS | Status: DC
  Administered 2014-02-14: 2 [IU] via SUBCUTANEOUS

## 2014-02-14 MED ORDER — ACETAMINOPHEN 650 MG RE SUPP: 650.0000 mg | Freq: Four times a day (QID) | RECTAL | Status: DC | PRN

## 2014-02-14 MED ORDER — MORPHINE SULFATE 2 MG/ML IJ SOLN
1.0000 mg | Freq: Four times a day (QID) | INTRAMUSCULAR | Status: DC | PRN
  Administered 2014-02-18 – 2014-02-20 (×4): 1 mg via INTRAVENOUS
  Filled 2014-02-14 (×4): qty 1

## 2014-02-14 MED ORDER — DILTIAZEM HCL 25 MG/5ML IV SOLN
20.0000 mg | Freq: Once | INTRAVENOUS | Status: AC
  Administered 2014-02-14: 20 mg via INTRAVENOUS
  Filled 2014-02-14: qty 5

## 2014-02-14 MED ORDER — CETYLPYRIDINIUM CHLORIDE 0.05 % MT LIQD
7.0000 mL | Freq: Two times a day (BID) | OROMUCOSAL | Status: DC
  Administered 2014-02-14 – 2014-02-22 (×14): 7 mL via OROMUCOSAL

## 2014-02-14 MED ORDER — SODIUM CHLORIDE 0.9 % IV SOLN
2500.0000 mg | Freq: Once | INTRAVENOUS | Status: AC
  Administered 2014-02-14: 2500 mg via INTRAVENOUS
  Filled 2014-02-14: qty 2500

## 2014-02-14 MED ORDER — DILTIAZEM HCL 100 MG IV SOLR: 5.0000 mg/h | Freq: Once | INTRAVENOUS | Status: DC

## 2014-02-14 MED ORDER — ALBUTEROL SULFATE (2.5 MG/3ML) 0.083% IN NEBU: 3.0000 mL | INHALATION_SOLUTION | Freq: Four times a day (QID) | RESPIRATORY_TRACT | Status: DC | PRN

## 2014-02-14 MED ORDER — SODIUM CHLORIDE 0.9 % IV BOLUS (SEPSIS)
500.0000 mL | Freq: Once | INTRAVENOUS | Status: AC
  Administered 2014-02-14: 500 mL via INTRAVENOUS

## 2014-02-14 MED ORDER — PIPERACILLIN-TAZOBACTAM 3.375 G IVPB
3.3750 g | Freq: Three times a day (TID) | INTRAVENOUS | Status: DC
  Administered 2014-02-14 – 2014-02-17 (×8): 3.375 g via INTRAVENOUS
  Filled 2014-02-14 (×11): qty 50

## 2014-02-14 MED ORDER — HEPARIN BOLUS VIA INFUSION
4000.0000 [IU] | Freq: Once | INTRAVENOUS | Status: AC
  Administered 2014-02-14: 4000 [IU] via INTRAVENOUS
  Filled 2014-02-14: qty 4000

## 2014-02-14 MED ORDER — VANCOMYCIN HCL 10 G IV SOLR: 2500.0000 mg | INTRAVENOUS | Status: DC

## 2014-02-14 MED ORDER — DIGOXIN 0.0625 MG HALF TABLET
0.0625 mg | ORAL_TABLET | Freq: Every day | ORAL | Status: DC
  Administered 2014-02-15 – 2014-02-18 (×4): 0.0625 mg via ORAL
  Filled 2014-02-14 (×7): qty 1

## 2014-02-14 MED ORDER — ONDANSETRON HCL 4 MG/2ML IJ SOLN
4.0000 mg | Freq: Once | INTRAMUSCULAR | Status: AC
  Administered 2014-02-14: 4 mg via INTRAVENOUS
  Filled 2014-02-14: qty 2

## 2014-02-14 MED ORDER — HYDROCODONE-ACETAMINOPHEN 5-325 MG PO TABS
1.0000 | ORAL_TABLET | Freq: Three times a day (TID) | ORAL | Status: DC | PRN
  Administered 2014-02-15 – 2014-02-17 (×3): 1 via ORAL
  Filled 2014-02-14 (×3): qty 1

## 2014-02-14 MED ORDER — ASPIRIN 300 MG RE SUPP
300.0000 mg | Freq: Once | RECTAL | Status: AC
  Administered 2014-02-14: 300 mg via RECTAL
  Filled 2014-02-14: qty 1

## 2014-02-14 MED ORDER — DILTIAZEM HCL ER COATED BEADS 120 MG PO CP24
120.0000 mg | ORAL_CAPSULE | Freq: Every day | ORAL | Status: DC
  Administered 2014-02-15 – 2014-02-16 (×2): 120 mg via ORAL
  Filled 2014-02-14 (×2): qty 1

## 2014-02-14 MED ORDER — ACETAMINOPHEN 650 MG RE SUPP
650.0000 mg | Freq: Once | RECTAL | Status: AC
Start: 2014-02-14 — End: 2014-02-14
  Administered 2014-02-14: 650 mg via RECTAL
  Filled 2014-02-14: qty 1

## 2014-02-14 NOTE — H&P (Signed)
Joy Benitez is an 69 y.o. female.   PCP:   Gwen Pounds, MD   Chief Complaint:   Fever, confusion, cellulitis.  + Trop I  HPI:  69 YO WF with pulm htn, chronic R CHF, Severe Pulmonary HTN/Cor Pulmonale from Obesity Hypoventilation and COPD, Morbid obesity, Chronic AFib refuses anticoagulation, poorly controlled DM, presents with leg swelling, confusion and fever.  She had admission in September for same issues which turned out to be Panniculitis/Leukocytosis/Cellulitis.  She was not doing well yesterday and EMS came out and she did not want to come to ED.  I got called per husband this am stating she is weak, AFTT, and falling.  I asked that he call 911 and have her come to ED.  In ED Temp > 101, WBC 26K, Lactic Acid elevated and leg cellulitis.  Broad Spectrum Abx and Tylenol given and I was called for inpt admission. She has been having bilateral leg pain and redness for the last week. Also has been having shortness of breath and productive cough for 2 days. Hasn't been able to eat anything for 2 days. She also has subjective fevers for a week.  As per EMS, she was covered with urine. She also had worsening shortness of breath en route and had HR 140s and was in rapid afib on arrival.  Started on Cardizem IV.  Dxed c Sepsis.  Unable to diurese due to low BP.  She is already ruling in for a Demand MI c Trop I  5+ Will need admission to step down   Past Medical History:  Past Medical History  Diagnosis Date  . Atrial fibrillation   . Hypertension   . History of blood transfusion 1975    "w/my daughter's birth"  . Chronic diastolic CHF (congestive heart failure), NYHA class 2   . Type II diabetes mellitus   . Anemia   . Anxiety   . Arthritis     "hips down" (11/06/2013)  . Diabetic peripheral neuropathy     Past Surgical History  Procedure Laterality Date  . Cholecystectomy  1999  . Incision and drainage abscess  2002    "in my groin"  . Cesarean section  1975; 1985  .  Cataract extraction w/ intraocular lens implant Left 2013      Allergies:   Allergies  Allergen Reactions  . Levaquin [Levofloxacin In D5w] Itching     Medications: Prior to Admission medications   Medication Sig Start Date End Date Taking? Authorizing Provider  acetaminophen (TYLENOL) 325 MG tablet Take 2 tablets (650 mg total) by mouth every 6 (six) hours as needed for mild pain, moderate pain or fever. 11/12/13   Gwen Pounds, MD  albuterol (PROVENTIL) (2.5 MG/3ML) 0.083% nebulizer solution Inhale 3 mLs into the lungs every 6 (six) hours as needed for wheezing or shortness of breath. 11/12/13   Gwen Pounds, MD  ALPRAZolam Prudy Feeler) 1 MG tablet Take 0.5 tablets (0.5 mg total) by mouth 2 (two) times daily as needed for anxiety. 11/12/13   Gwen Pounds, MD  Amino Acids-Protein Hydrolys (FEEDING SUPPLEMENT, PRO-STAT SUGAR FREE 64,) LIQD Take 30 mLs by mouth 3 (three) times daily with meals. 11/12/13   Gwen Pounds, MD  CARTIA XT 120 MG 24 hr capsule Take 120 mg by mouth daily. 10/08/13   Historical Provider, MD  DIGOX 125 MCG tablet Take 0.0625 mg by mouth daily.  10/29/13   Historical Provider, MD  doxycycline (VIBRAMYCIN) 100 MG capsule Take  100 mg by mouth 2 (two) times daily.    Historical Provider, MD  furosemide (LASIX) 20 MG tablet Take 60 mg by mouth 2 (two) times daily.  10/08/13   Historical Provider, MD  gabapentin (NEURONTIN) 300 MG capsule Take 600 mg by mouth 2 (two) times daily.  10/14/13   Historical Provider, MD  hydrocerin (EUCERIN) CREA Apply 1 application topically daily. 11/12/13   Gwen Pounds, MD  HYDROcodone-acetaminophen (NORCO/VICODIN) 5-325 MG per tablet Take 1 tablet by mouth every 8 (eight) hours as needed for moderate pain. 11/12/13   Gwen Pounds, MD  insulin aspart (NOVOLOG) 100 UNIT/ML injection CBG 70 - 120: 0 units CBG 121 - 150: 2 units CBG 151 - 200: 3 units CBG 201 - 250: 5 units CBG 251 - 300: 8 units CBG 301 - 350: 11 units CBG 351 - 400: 15 units CBG >  400: 20 units and call MD 11/12/13   Gwen Pounds, MD  insulin glargine (LANTUS) 100 UNIT/ML injection Inject 0.18 mLs (18 Units total) into the skin at bedtime. 11/12/13   Gwen Pounds, MD  metFORMIN (GLUCOPHAGE) 500 MG tablet Take 500 mg by mouth 2 (two) times daily. 10/29/13   Historical Provider, MD  Multiple Vitamin (MULTIVITAMIN WITH MINERALS) TABS tablet Take 1 tablet by mouth daily. 11/12/13   Gwen Pounds, MD  nicotine (NICODERM CQ - DOSED IN MG/24 HR) 7 mg/24hr patch Place 1 patch (7 mg total) onto the skin daily. 11/12/13   Gwen Pounds, MD  polyethylene glycol [facility] / Ethelene Hal) packet Take 17 g by mouth daily. 11/12/13   Gwen Pounds, MD  sorbitol 70 % SOLN Take 30 mLs by mouth daily as needed for moderate constipation. 11/12/13   Gwen Pounds, MD  spironolactone (ALDACTONE) 25 MG tablet Take 25 mg by mouth daily. 10/10/13   Historical Provider, MD      (Not in a hospital admission)   Social History:  reports that she has been smoking Cigarettes.  She has a 12 pack-year smoking history. She has never used smokeless tobacco. She reports that she does not drink alcohol or use illicit drugs.  Family History: Family History  Problem Relation Age of Onset  . Heart attack Father     Review of Systems:  Review of Systems - See HPI All ROS obtained Confused and ill   Physical Exam:  Blood pressure 88/52, pulse 85, temperature 101.4 F (38.6 C), temperature source Rectal, resp. rate 17, SpO2 96 %. Filed Vitals:   03-09-2014 1239 March 09, 2014 1245 Mar 09, 2014 1300 03-09-14 1330  BP: 98/66 104/72 104/73 88/52  Pulse: 94 79 76 85  Temp:      TempSrc:      Resp: 27 19 19 17   SpO2: 95% 94% 96% 96%   General appearance: Sick appearing.  Ill OP - Dry Neck: no adenopathy, no carotid bruit,  Hard to assess JVD thyroid not enlarged, symmetric, no tenderness/mass/nodules Resp: Distant Cardio: Irreg/Rapid GI: Obese, mildly tender. Extremities: 2-3 + Edema, Venosus stasis, cellulitis,  Excoriations, tender. BPulses: 2+ and symmetric Lymph nodes: no cervical lymphadenopathy Neurologic: Not that interactive.  Will wake up and answer some ?s and able to follow commands.  Some confusion noted.  Gen Weak.      Labs on Admission:   Recent Labs  03-09-2014 0951  NA 134*  K 4.8  CL 99  CO2 20  GLUCOSE 260*  BUN 35*  CREATININE 1.80*  CALCIUM 8.7  Recent Labs  02/20/2014 0951  AST 44*  ALT 11  ALKPHOS 101  BILITOT 2.5*  PROT 8.0  ALBUMIN 2.9*   No results for input(s): LIPASE, AMYLASE in the last 72 hours.  Recent Labs  03/10/2014 0951  WBC 26.6*  NEUTROABS 22.6*  HGB 12.3  HCT 39.2  MCV 85.6  PLT 361   No results for input(s): CKTOTAL, CKMB, CKMBINDEX, TROPONINI in the last 72 hours. Lab Results  Component Value Date   INR 1.74* 04/22/2010   INR 1.59* 04/21/2010   INR 1.62* 04/20/2010     LAB RESULT POCT:  Results for orders placed or performed during the hospital encounter of 03/10/2014  CBC with Differential  Result Value Ref Range   WBC 26.6 (H) 4.0 - 10.5 K/uL   RBC 4.58 3.87 - 5.11 MIL/uL   Hemoglobin 12.3 12.0 - 15.0 g/dL   HCT 16.1 09.6 - 04.5 %   MCV 85.6 78.0 - 100.0 fL   MCH 26.9 26.0 - 34.0 pg   MCHC 31.4 30.0 - 36.0 g/dL   RDW 40.9 (H) 81.1 - 91.4 %   Platelets 361 150 - 400 K/uL   Neutrophils Relative % 85 (H) 43 - 77 %   Lymphocytes Relative 7 (L) 12 - 46 %   Monocytes Relative 8 3 - 12 %   Eosinophils Relative 0 0 - 5 %   Basophils Relative 0 0 - 1 %   Neutro Abs 22.6 (H) 1.7 - 7.7 K/uL   Lymphs Abs 1.9 0.7 - 4.0 K/uL   Monocytes Absolute 2.1 (H) 0.1 - 1.0 K/uL   Eosinophils Absolute 0.0 0.0 - 0.7 K/uL   Basophils Absolute 0.0 0.0 - 0.1 K/uL   WBC Morphology INCREASED BANDS (>20% BANDS)    Smear Review LARGE PLATELETS PRESENT   Comprehensive metabolic panel  Result Value Ref Range   Sodium 134 (L) 135 - 145 mmol/L   Potassium 4.8 3.5 - 5.1 mmol/L   Chloride 99 96 - 112 mEq/L   CO2 20 19 - 32 mmol/L   Glucose, Bld  260 (H) 70 - 99 mg/dL   BUN 35 (H) 6 - 23 mg/dL   Creatinine, Ser 7.82 (H) 0.50 - 1.10 mg/dL   Calcium 8.7 8.4 - 95.6 mg/dL   Total Protein 8.0 6.0 - 8.3 g/dL   Albumin 2.9 (L) 3.5 - 5.2 g/dL   AST 44 (H) 0 - 37 U/L   ALT 11 0 - 35 U/L   Alkaline Phosphatase 101 39 - 117 U/L   Total Bilirubin 2.5 (H) 0.3 - 1.2 mg/dL   GFR calc non Af Amer 28 (L) >90 mL/min   GFR calc Af Amer 33 (L) >90 mL/min   Anion gap 15 5 - 15  Urinalysis, Routine w reflex microscopic  Result Value Ref Range   Color, Urine AMBER (A) YELLOW   APPearance CLOUDY (A) CLEAR   Specific Gravity, Urine 1.018 1.005 - 1.030   pH 6.0 5.0 - 8.0   Glucose, UA NEGATIVE NEGATIVE mg/dL   Hgb urine dipstick NEGATIVE NEGATIVE   Bilirubin Urine SMALL (A) NEGATIVE   Ketones, ur NEGATIVE NEGATIVE mg/dL   Protein, ur NEGATIVE NEGATIVE mg/dL   Urobilinogen, UA 1.0 0.0 - 1.0 mg/dL   Nitrite POSITIVE (A) NEGATIVE   Leukocytes, UA MODERATE (A) NEGATIVE  Digoxin level  Result Value Ref Range   Digoxin Level 0.5 (L) 0.8 - 2.0 ng/mL  Brain natriuretic peptide  Result Value Ref Range  B Natriuretic Peptide 1237.6 (H) 0.0 - 100.0 pg/mL  Urine microscopic-add on  Result Value Ref Range   Squamous Epithelial / LPF FEW (A) RARE   WBC, UA 11-20 <3 WBC/hpf   Bacteria, UA MANY (A) RARE  I-stat troponin, ED  Result Value Ref Range   Troponin i, poc 5.54 (HH) 0.00 - 0.08 ng/mL   Comment NOTIFIED PHYSICIAN    Comment 3          I-Stat CG4 Lactic Acid, ED  Result Value Ref Range   Lactic Acid, Venous 5.21 (H) 0.5 - 2.2 mmol/L  CBG monitoring, ED  Result Value Ref Range   Glucose-Capillary 252 (H) 70 - 99 mg/dL   Comment 1 Documented in Chart    Comment 2 Notify RN       Radiological Exams on Admission: Dg Chest Port 1 View  03/04/2014   CLINICAL DATA:  Shortness of breath an rapid atrial fibrillation.  EXAM: PORTABLE CHEST - 1 VIEW  COMPARISON:  11/09/2013  FINDINGS: Stable cardiac enlargement. Diffuse interstitial edema present.  No significant pleural fluid identified.  IMPRESSION: Cardiomegaly with diffuse interstitial edema.   Electronically Signed   By: Irish Lack M.D.   On: 02/28/2014 10:54      Orders placed or performed during the hospital encounter of 03/15/2014  . EKG 12-Lead  . EKG 12-Lead  . EKG 12-Lead  . EKG 12-Lead     Assessment/Plan Principal Problem:   Sepsis Active Problems:   Atrial fibrillation with tachycardic ventricular rate   Type 2 diabetes, uncontrolled, with neuropathy   Morbid obesity   Cellulitis and abscess of leg   Cor pulmonale, chronic   Pulmonary hypertension   Diastolic CHF with preserved left ventricular function, NYHA class 2   Obstructive chronic bronchitis without exacerbation   MI (myocardial infarction)   SEPSIS = Leukocytosis up to 26.6/Cellulitis/Fevers/AMS/Very Elevated Lactaic Acid c/w Sepsis - In ED seen and started on Vanco/Zosyn.  Will need some fluid resuscitation to keep SBP @ 100.  Try and hold on Pressors unless crads thinks Dobutamine necessary? Blood cxs sent. She had LE Cellulitis in September and Hospital course complicated by Delirium.  She got very weak and needed SNF for rehab stay post hospitalization.  Current Cr 1.8 and will be followed.  No HD needs identified.  Acute Demand MI and elevated PRO-BNP secondary to Sepsis and presumed underlying CAD.  Conservative management.  May need CATH after Acute issues resolve but c Cr 1.8 we would need to be careful.  Needs fluids for Sepsis and may make her Vol Overload worse.  Follow Trop I.  AFIb RVR - Cardizem IV/Dig/lovenox. Dig level 0.5.  She has refused Coumadin several in the past as she was on it for a little while and come off on her own. She has also refused the newer agents.  Dr Graciela Husbands consulted.   CHF - Last ECHO 11/05/13 c/w HFpEF - EF 50-55%-Abnormal septal motion. The cavity size was normal. Wall thickness was increased in a pattern of severe LVH. RA and RV Mod Dilated;  PA peak  pressure: 43 mm Hg. Diastolic/R Sided c recurring Anasarca although recent Ab [place] in March 2015 showed Hepatomegaly with suspected hepatic steatosis/Prior cholecystectomy/No ascites. Continue Lasix/Aldactone as BP tolerates  Severe Pulmonary HTN/Cor Pulmonale from Obesity Hypoventilation and COPD.  She has not wanted formal sleep study and CPA/BiPAP use  Morbid obesity - Goal Wt is < 300.  She fluctuates up and down 50#s depending on Edema.  Due to chronic illness she also has Prot Cal Malnutrition - Low Albumin - needs wt loss but to be able to maintain muscle mass. Consult nutrition when some better  COPD/Smoker - Needs to stay quit forever - Provide inhalers/Nebs prn Hypertension - Now Hypotensive due to sepsis. Try to restore BP Anemia - Hbg at 12.3.  Will follow Chronic Low back pain/Degenerative disk disease/Gait Instability/Fall Risk/Chronic OA - Walker.  DM2 Neuropathy. - Gabapentin when able to take meds Chronic Venous insufficiency with chronic venous stasis c lower extremity edema.  History recurrent cellulitis/extensive groin abscess.   Type II or unspecified type diabetes mellitus with neurological manifestations, uncontrolled- insulin as able- Lantus/Novolog- Hold Metformin c current lactic acidosis  DNR confirmed.  Admit to Stepdown, IV Abx, Lasix and aldactone held, Cardizem prn and oral as able.  Continue Dig Low dose.   Keysha Damewood M Feb 23, 2014, 1:50 PM

## 2014-02-14 NOTE — ED Provider Notes (Signed)
CSN: [id number]     Arrival date & time 03-09-14  4098 History   First MD Initiated Contact with Patient 03/09/2014 [phone]     Chief Complaint  Patient presents with  . Shortness of Breath     (Consider location/radiation/quality/duration/timing/severity/associated sxs/prior Treatment) The history is provided by the patient.  Joy Benitez is a 69 y.o. female hx of afib on digoxin (no coumadin), CHF, DM recurrent leg cellulitis here with leg pain, shortness of breath. She has been having bilateral leg pain and redness for the last week. Also has been having shortness of breath and productive cough for 2 days. Hasn't been able to eat anything for 2 days. She also has subjective fevers for a week. Of note, she was admitted in September for leg cellulitis and rapid afib. As per EMS, she was covered with urine. She also had worsening shortness of breath en route and had HR 140s and was in rapid afib on arrival.   Level V caveat- condition of patient     Past Medical History  Diagnosis Date  . Atrial fibrillation   . Hypertension   . History of blood transfusion 1975    "w/my daughter's birth"  . CHF (congestive heart failure)   . Type II diabetes mellitus   . Anemia   . Anxiety   . Arthritis     "hips down" (11/06/2013)  . Diabetic peripheral neuropathy    Past Surgical History  Procedure Laterality Date  . Cholecystectomy  1999  . Incision and drainage abscess  2002    "in my groin"  . Cesarean section  1975; 1985  . Cataract extraction w/ intraocular lens implant Left 2013   No family history on file. History  Substance Use Topics  . Smoking status: Current Every Day Smoker -- 0.25 packs/day for 48 years    Types: Cigarettes  . Smokeless tobacco: Never Used  . Alcohol Use: No   OB History    No data available     Review of Systems  Constitutional: Positive for fever.  Respiratory: Positive for cough and shortness of breath.   Skin: Positive for rash.  All other  systems reviewed and are negative.     Allergies  Levaquin  Home Medications   Prior to Admission medications   Medication Sig Start Date End Date Taking? Authorizing Provider  acetaminophen (TYLENOL) 325 MG tablet Take 2 tablets (650 mg total) by mouth every 6 (six) hours as needed for mild pain, moderate pain or fever. 11/12/13   Gwen Pounds, MD  albuterol (PROVENTIL) (2.5 MG/3ML) 0.083% nebulizer solution Inhale 3 mLs into the lungs every 6 (six) hours as needed for wheezing or shortness of breath. 11/12/13   Gwen Pounds, MD  ALPRAZolam Prudy Feeler) 1 MG tablet Take 0.5 tablets (0.5 mg total) by mouth 2 (two) times daily as needed for anxiety. 11/12/13   Gwen Pounds, MD  Amino Acids-Protein Hydrolys (FEEDING SUPPLEMENT, PRO-STAT SUGAR FREE 64,) LIQD Take 30 mLs by mouth 3 (three) times daily with meals. 11/12/13   Gwen Pounds, MD  CARTIA XT 120 MG 24 hr capsule Take 120 mg by mouth daily. 10/08/13   Historical Provider, MD  DIGOX 125 MCG tablet Take 0.0625 mg by mouth daily.  10/29/13   Historical Provider, MD  doxycycline (VIBRAMYCIN) 100 MG capsule Take 100 mg by mouth 2 (two) times daily.    Historical Provider, MD  furosemide (LASIX) 20 MG tablet Take 60 mg by mouth 2 (  two) times daily.  10/08/13   Historical Provider, MD  gabapentin (NEURONTIN) 300 MG capsule Take 600 mg by mouth 2 (two) times daily.  10/14/13   Historical Provider, MD  hydrocerin (EUCERIN) CREA Apply 1 application topically daily. 11/12/13   Gwen Pounds, MD  HYDROcodone-acetaminophen (NORCO/VICODIN) 5-325 MG per tablet Take 1 tablet by mouth every 8 (eight) hours as needed for moderate pain. 11/12/13   Gwen Pounds, MD  insulin aspart (NOVOLOG) 100 UNIT/ML injection CBG 70 - 120: 0 units CBG 121 - 150: 2 units CBG 151 - 200: 3 units CBG 201 - 250: 5 units CBG 251 - 300: 8 units CBG 301 - 350: 11 units CBG 351 - 400: 15 units CBG > 400: 20 units and call MD 11/12/13   Gwen Pounds, MD  insulin glargine (LANTUS) 100 UNIT/ML  injection Inject 0.18 mLs (18 Units total) into the skin at bedtime. 11/12/13   Gwen Pounds, MD  metFORMIN (GLUCOPHAGE) 500 MG tablet Take 500 mg by mouth 2 (two) times daily. 10/29/13   Historical Provider, MD  Multiple Vitamin (MULTIVITAMIN WITH MINERALS) TABS tablet Take 1 tablet by mouth daily. 11/12/13   Gwen Pounds, MD  nicotine (NICODERM CQ - DOSED IN MG/24 HR) 7 mg/24hr patch Place 1 patch (7 mg total) onto the skin daily. 11/12/13   Gwen Pounds, MD  polyethylene glycol [facility] / Ethelene Hal) packet Take 17 g by mouth daily. 11/12/13   Gwen Pounds, MD  sorbitol 70 % SOLN Take 30 mLs by mouth daily as needed for moderate constipation. 11/12/13   Gwen Pounds, MD  spironolactone (ALDACTONE) 25 MG tablet Take 25 mg by mouth daily. 10/10/13   Historical Provider, MD   BP 94/53 mmHg  Pulse 103  Temp(Src) 101.4 F (38.6 C) (Rectal)  Resp 17  SpO2 93% Physical Exam  Constitutional: She is oriented to person, place, and time.  Chronically ill, covered in urine   HENT:  Head: Normocephalic.  MM dry   Eyes: Conjunctivae are normal. Pupils are equal, round, and reactive to light.  Neck: Normal range of motion. Neck supple.  Cardiovascular:  Tachy, irregular   Pulmonary/Chest:  Diminished bilateral bases.   Abdominal:  Obese, + pitting edema. No obvious tenderness   Musculoskeletal:  2+ edema bilateral legs with dependent swelling. + cellulitis bilateral legs   Neurological: She is alert and oriented to person, place, and time. No cranial nerve deficit. Coordination normal.  Skin: Skin is warm.  Psychiatric: She has a normal mood and affect. Her behavior is normal. Judgment and thought content normal.  Nursing note and vitals reviewed.   ED Course  Procedures (including critical care time)  CRITICAL CARE Performed by: Silverio Lay, DAVID   Total critical care time: 30 min   Critical care time was exclusive of separately billable procedures and treating other patients.  Critical care was  necessary to treat or prevent imminent or life-threatening deterioration.  Critical care was time spent personally by me on the following activities: development of treatment plan with patient and/or surrogate as well as nursing, discussions with consultants, evaluation of patient's response to treatment, examination of patient, obtaining history from patient or surrogate, ordering and performing treatments and interventions, ordering and review of laboratory studies, ordering and review of radiographic studies, pulse oximetry and re-evaluation of patient's condition.   Labs Review Labs Reviewed  CBC WITH DIFFERENTIAL - Abnormal; Notable for the following:    WBC 26.6 (*)  RDW 19.4 (*)    Neutrophils Relative % 85 (*)    Lymphocytes Relative 7 (*)    Neutro Abs 22.6 (*)    Monocytes Absolute 2.1 (*)    All other components within normal limits  COMPREHENSIVE METABOLIC PANEL - Abnormal; Notable for the following:    Sodium 134 (*)    Glucose, Bld 260 (*)    BUN 35 (*)    Creatinine, Ser 1.80 (*)    Albumin 2.9 (*)    AST 44 (*)    Total Bilirubin 2.5 (*)    GFR calc non Af Amer 28 (*)    GFR calc Af Amer 33 (*)    All other components within normal limits  DIGOXIN LEVEL - Abnormal; Notable for the following:    Digoxin Level 0.5 (*)    All other components within normal limits  BRAIN NATRIURETIC PEPTIDE - Abnormal; Notable for the following:    B Natriuretic Peptide 1237.6 (*)    All other components within normal limits  I-STAT TROPOININ, ED - Abnormal; Notable for the following:    Troponin i, poc 5.54 (*)    All other components within normal limits  I-STAT CG4 LACTIC ACID, ED - Abnormal; Notable for the following:    Lactic Acid, Venous 5.21 (*)    All other components within normal limits  CBG MONITORING, ED - Abnormal; Notable for the following:    Glucose-Capillary 252 (*)    All other components within normal limits  URINE CULTURE  CULTURE, BLOOD (ROUTINE X 2)   CULTURE, BLOOD (ROUTINE X 2)  URINALYSIS, ROUTINE W REFLEX MICROSCOPIC  HEPARIN LEVEL (UNFRACTIONATED)    Imaging Review Dg Chest Port 1 View  2014-03-11   CLINICAL DATA:  Shortness of breath an rapid atrial fibrillation.  EXAM: PORTABLE CHEST - 1 VIEW  COMPARISON:  11/09/2013  FINDINGS: Stable cardiac enlargement. Diffuse interstitial edema present. No significant pleural fluid identified.  IMPRESSION: Cardiomegaly with diffuse interstitial edema.   Electronically Signed   By: Irish Lack M.D.   On: 2014/03/11 10:54     EKG Interpretation   Date/Time:  Saturday March 11, 2014 10:01:22 EST Ventricular Rate:  136 PR Interval:    QRS Duration: 79 QT Interval:  279 QTC Calculation: 420 R Axis:   126 Text Interpretation:  Atrial fibrillation Paired ventricular premature  complexes Right axis deviation Low voltage, precordial leads Nonspecific T  abnormalities, lateral leads Poor baseline,  but has rapid afib  Confirmed  by YAO  MD, DAVID ([id number]) on 03-11-2014 10:20:03 AM      MDM   Final diagnoses:  Fever    APIRL ARZUAGA is a 69 y.o. female here with fever, tachycardia, leg cellulitis, cough. Will do sepsis workup. Likely will be in rapid afib and will give meds. Will admit.   11:29 AM WBC 26, likely from pneumonia vs cellulitis. Started on vanc/zosyn. Also positive troponin and CXR showed interstitial edema. Also in rapid afib. On cardizem drip, heparin drip. Unable to diurese due to low BP and in the setting of infection. Also has acute renal failure. I called Dr. Graciela Husbands from cardiology to consult. Will admit to stepdown with [facility].   11:41 AM BP dec to 90s after cardizem bolus. Will hold drip.    Richardean Canal, MD Mar 11, 2014 1155

## 2014-02-14 NOTE — Progress Notes (Addendum)
ANTIBIOTIC/ANTICOAGULATION CONSULT NOTE - INITIAL  Pharmacy Consult for vancomycin, zosyn and heparin Indication: cellulitis, afib  Allergies  Allergen Reactions  . Levaquin [Levofloxacin In D5w] Itching    Patient Measurements:   Adjusted Body Weight: 90 kg  Vital Signs: Temp: 101.4 F (38.6 C) (01/02 1118) Temp Source: Rectal (01/02 1118) BP: 94/53 mmHg (01/02 1118) Pulse Rate: 103 (01/02 1118) Intake/Output from previous day:   Intake/Output from this shift:    Labs:  Recent Labs  2014/03/15 0951  WBC 26.6*  HGB 12.3  PLT 361  CREATININE 1.80*   CrCl cannot be calculated (Unknown ideal weight.). No results for input(s): VANCOTROUGH, VANCOPEAK, VANCORANDOM, GENTTROUGH, GENTPEAK, GENTRANDOM, TOBRATROUGH, TOBRAPEAK, TOBRARND, AMIKACINPEAK, AMIKACINTROU, AMIKACIN in the last 72 hours.   Microbiology: No results found for this or any previous visit (from the past 720 hour(s)).  Medical History: Past Medical History  Diagnosis Date  . Atrial fibrillation   . Hypertension   . History of blood transfusion 1975    "w/my daughter's birth"  . CHF (congestive heart failure)   . Type II diabetes mellitus   . Anemia   . Anxiety   . Arthritis     "hips down" (11/06/2013)  . Diabetic peripheral neuropathy     Medications:  See med history Assessment: 69 yo lady to start broad spectrum antibiotics for cellulitis and heparin for rapid afib. CrCl ~55 ml/min based on obesity.  Goal of Therapy:  Vancomycin trough level 15-20 mcg/ml Heparin level 0.3-0.7 units/ml  Plan:  Heparin bolus 4000 units and drip at 1450 units/hr Check HL in 6 hours Zosyn 3.375 gm IV X 1 the 3.375 gm IV q8 hours Vancomycin 2500 mg IV X 1 then 1250 mg IV q12 hours Daily HL and CBC F/u renal function, cultures and clinical course  Thanks for allowing pharmacy to be a part of this patient's care.  Talbert Cage, PharmD Clinical Pharmacist, [phone]  2014/03/15,11:31 AM

## 2014-02-14 NOTE — ED Notes (Signed)
Cardiology at bedside.

## 2014-02-14 NOTE — Progress Notes (Signed)
ANTICOAGULATION CONSULT NOTE - Follow Up Consult  Pharmacy Consult for heparin Indication: atrial fibrillation  Allergies  Allergen Reactions  . Levaquin [Levofloxacin In D5w] Itching    Patient Measurements: Height: 5\' 5"  (165.1 cm) Weight: (!) 343 lb 14.7 oz (156 kg) IBW/kg (Calculated) : 57 Heparin Dosing Weight: 96kg  Vital Signs: Temp: 97.5 F (36.4 C) (01/02 1936) Temp Source: Oral (01/02 1936) BP: 103/72 mmHg (01/02 1936) Pulse Rate: 80 (01/02 1936)  Labs:  Recent Labs  02/13/2014 0951 02/27/2014 1625 03/13/2014 1841  HGB 12.3  --   --   HCT 39.2  --   --   PLT 361  --   --   HEPARINUNFRC  --   --  0.16*  CREATININE 1.80*  --   --   CKTOTAL  --  355*  --   TROPONINI  --  7.63*  --     Estimated Creatinine Clearance: 46.9 mL/min (by C-G formula based on Cr of 1.8).   Medications:  Heparin @ 1450 units/hr  Assessment: 68 YOF brought in today with rapid AFib. Started on heparin- initial level low at 0.16 units/mL. RN did not receive information regarding issues with line or reasons for gtt to be held from day RN. No bleeding noted in chart.   Goal of Therapy:  Heparin level 0.3-0.7 units/ml Monitor platelets by anticoagulation protocol: Yes   Plan: -rebolus with 3000 units heparin IV x1, then increase gtt to 1700 units/hr -recheck heparin level in 6 hours -daily HL and CBC -follow for s/s bleeding  Tahiri Shareef D. Latitia Housewright, PharmD, BCPS Clinical Pharmacist Pager: [phone] 03/05/2014 7:46 PM

## 2014-02-14 NOTE — ED Notes (Signed)
Pt reports to the ED for eval of bilateral leg pain x 2 days. Legs appear swollen an erythematous. Multiple wounds noted to legs and purulent drainage reported from wounds. Upon EMS arrival she was lying flat on her back and her lips were cyanotic and she was SOB. Pts HR irregular and in the 120s. She is A. Fib on the monitor with history of the same. Pt also has a productive coug and is gagging. O2 sats 80s on RA. Placed on 2 L and now O2 sats > 90%. Pt alert but she does not answer questions. Repetitively crying out and moaning. She is tacypnic and tachycardic.

## 2014-02-14 NOTE — ED Notes (Signed)
Attempted report 

## 2014-02-14 NOTE — Consult Note (Signed)
CARDIOLOGY CONSULT NOTE   Patient ID: Joy Benitez MRN: [id number] DOB/AGE: 1945/05/15 69 y.o.  Admit date: 2014/03/12  Primary Physician   Gwen Pounds, MD Primary Cardiologist   New Reason for Consultation   CHF, atrial fib, elevated troponin  Joy Benitez is a 69 y.o. female with no history of CAD.  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 She was discharged on 09/30. Her weight at discharge was 371 pounds. She initially was in a skilled facility after discharge but was discharged home from there in October 2015.  Dr. Timothy Lasso follows her closely, but she is poorly compliant with fluid restrictions and dietary restrictions and continues to smoke.  She was doing well at Thanksgiving, ambulating with a walker, participating in family gatherings and even doing a small amount of housework.   Since then, however, her activity level has gone downhill. She does not weigh herself. The family is pretty sure she has gained weight but can't say how much. Her abdomen is swollen and tighter than previously seen. She has been more more short of breath, has PND and orthopnea. She was not feeling well yesterday, but refused EMS transport. She fell today while getting from the bed to the bedside commode. The family got her up to the bed but she is completely unable to move herself around in the bed. She was transported today by EMS to the emergency room, and was found to be febrile with a white count of 26,000. She was also in heart failure and in rapid atrial fibrillation so cardiology was asked to evaluate her.  She denies any recent chest pain, palpitations or presyncope. She has no idea how much weight she has gained and has some chronic problems with lower extremity edema so can't say if her legs are more swollen than usual.  Past Medical History  Diagnosis  Date  . Atrial fibrillation   . Hypertension   . History of blood transfusion 1975    "w/my daughter's birth"  . Chronic diastolic CHF (congestive heart failure), NYHA class 2   . Type II diabetes mellitus   . Anemia   . Anxiety   . Arthritis     "hips down" (11/06/2013)  . Diabetic peripheral neuropathy      Past Surgical History  Procedure Laterality Date  . Cholecystectomy  1999  . Incision and drainage abscess  2002    "in my groin"  . Cesarean section  1975; 1985  . Cataract extraction w/ intraocular lens implant Left 2013    Allergies  Allergen Reactions  . Levaquin [Levofloxacin In D5w] Itching   I have reviewed the patient's current medications   . heparin 1,450 Units/hr (Mar 12, 2014 1041)  . vancomycin Stopped (03/12/14 1325)    Medication Sig  acetaminophen (TYLENOL) 325 MG tablet Take 2 tablets (650 mg total) by mouth every 6 (six) hours as needed for mild pain, moderate pain or fever.  albuterol (PROVENTIL) (2.5 MG/3ML) 0.083% nebulizer solution Inhale 3 mLs into the lungs every 6 (six) hours as needed for wheezing or shortness of breath.  ALPRAZolam (XANAX) 1 MG tablet Take 0.5 tablets (0.5 mg total) by mouth 2 (two) times daily as needed for anxiety.  Amino Acids-Protein Hydrolys (FEEDING SUPPLEMENT, PRO-STAT SUGAR FREE 64,) LIQD Take 30 mLs by mouth 3 (three) times daily with meals.  CARTIA XT 120 MG 24  hr capsule Take 120 mg by mouth daily.  DIGOX 125 MCG tablet Take 0.0625 mg by mouth daily.   doxycycline (VIBRAMYCIN) 100 MG capsule Take 100 mg by mouth 2 (two) times daily.  furosemide (LASIX) 20 MG tablet Take 60 mg by mouth 2 (two) times daily.   gabapentin (NEURONTIN) 300 MG capsule Take 600 mg by mouth 2 (two) times daily.   hydrocerin (EUCERIN) CREA Apply 1 application topically daily.  HYDROcodone-acetaminophen (NORCO/VICODIN) 5-325 MG per tablet Take 1 tablet by mouth every 8 (eight) hours as needed for moderate pain.  insulin aspart (NOVOLOG) 100  UNIT/ML injection CBG 70 - 120: 0 units CBG 121 - 150: 2 units CBG 151 - 200: 3 units CBG 201 - 250: 5 units CBG 251 - 300: 8 units CBG 301 - 350: 11 units CBG 351 - 400: 15 units CBG > 400: 20 units and call MD  insulin glargine (LANTUS) 100 UNIT/ML injection Inject 0.18 mLs (18 Units total) into the skin at bedtime.  metFORMIN (GLUCOPHAGE) 500 MG tablet Take 500 mg by mouth 2 (two) times daily.  Multiple Vitamin (MULTIVITAMIN WITH MINERALS) TABS tablet Take 1 tablet by mouth daily.  nicotine (NICODERM CQ - DOSED IN MG/24 HR) 7 mg/24hr patch Place 1 patch (7 mg total) onto the skin daily.  polyethylene glycol (MIRALAX / GLYCOLAX) packet Take 17 g by mouth daily.  sorbitol 70 % SOLN Take 30 mLs by mouth daily as needed for moderate constipation.  spironolactone (ALDACTONE) 25 MG tablet Take 25 mg by mouth daily.     History   Social History  . Marital Status: Married    Spouse Name: N/A    Number of Children: N/A  . Years of Education: N/A   Occupational History  . Retired Interior and spatial designer    Social History Main Topics  . Smoking status: Current Every Day Smoker -- 0.25 packs/day for 48 years    Types: Cigarettes  . Smokeless tobacco: Never Used  . Alcohol Use: No  . Drug Use: No  . Sexual Activity: No   Other Topics Concern  . Not on file   Social History Narrative   Siblings have hx CAD.    Family Status  Relation Status Death Age  . Mother Deceased 30    No known CAD  . Father Deceased 68    MI   Family History  Problem Relation Age of Onset  . Heart attack Father      ROS:  Full 14 point review of systems complete and found to be negative unless listed above.  Physical Exam: Blood pressure 88/52, pulse 85, temperature 101.4 F (38.6 C), temperature source Rectal, resp. rate 17, SpO2 96 %.  General: Well developed, morbidly obese, chronically ill appearing female who appears anxious and slightly short of breath. Head: Eyes PERRLA, No xanthomas.    Normocephalic and atraumatic, oropharynx without edema or exudate. Dentition: Poor Lungs: Rales bases Heart: Heart irregular rate and rhythm with S1, S2,  murmur. pulses are 2+ both upper extrem. Pulse is still cold to palpate in both lower extremities 2nd to edema and thickened skin   Neck: No carotid bruits. No lymphadenopathy.  JVD elevated but difficult to assess secondary to body habitus. Abdomen: Bowel sounds present, abdomen soft and non-tender without masses or hernias noted. Msk: Generalized weakness, no joint deformities or effusions. Extremities: No clubbing or cyanosis. One-2+ edema.  Neuro: Alert and oriented X 3. No focal deficits noted. Psych:  Upset, responds appropriately Skin: No  rashes or lesions noted.  Labs:   Lab Results  Component Value Date   WBC 26.6* 03/16/14   HGB 12.3 Mar 16, 2014   HCT 39.2 2014/03/16   MCV 85.6 2014-03-16   PLT 361 Mar 16, 2014    Recent Labs Lab 03-16-2014 0951  NA 134*  K 4.8  CL 99  CO2 20  BUN 35*  CREATININE 1.80*  CALCIUM 8.7  PROT 8.0  BILITOT 2.5*  ALKPHOS 101  ALT 11  AST 44*  GLUCOSE 260*  ALBUMIN 2.9*    Recent Labs  03-16-2014 0958  TROPIPOC 5.54*   B NATRIURETIC PEPTIDE  Date Value Ref Range Status  Mar 16, 2014 1237.6* 0.0 - 100.0 pg/mL Final    Comment:    Please note change in reference range.   Echo: 11/05/2013 Conclusions - Left ventricle: Abnormal septal motion. The cavity size was normal. Wall thickness was increased in a pattern of severe LVH. Systolic function was normal. The estimated ejection fraction was in the range of 50% to 55%. - Left atrium: The atrium was moderately dilated. - Right ventricle: The cavity size was moderately dilated. - Right atrium: The atrium was moderately dilated. - Atrial septum: No defect or patent foramen ovale was identified. - Pulmonary arteries: PA peak pressure: 43 mm Hg (S).  ECG: 03-16-14 Atrial fibrillation, rapid ventricular response Rate  136 Low amplitude   Radiology:  Dg Chest Port 1 View 03-16-2014   CLINICAL DATA:  Shortness of breath an rapid atrial fibrillation.  EXAM: PORTABLE CHEST - 1 VIEW  COMPARISON:  11/09/2013  FINDINGS: Stable cardiac enlargement. Diffuse interstitial edema present. No significant pleural fluid identified.  IMPRESSION: Cardiomegaly with diffuse interstitial edema.   Electronically Signed   By: Irish Lack M.D.   On: 16-Mar-2014 10:54    ASSESSMENT AND PLAN:   The patient was seen today by Dr Purvis Sheffield, the patient evaluated and the data reviewed.   Principal Problem:   Sepsis - per Dr. Timothy Lasso  Active Problems:   Atrial fibrillation with tachycardic ventricular rate - HR has settled down a bit, would use shorter-acting meds for now as her BP will permit, however with low BP and acute illness, will tolerate a higher HR for now.     Type 2 diabetes, uncontrolled, with neuropathy - per Dr. Timothy Lasso    Morbid obesity - per Dr. Timothy Lasso    Cellulitis and abscess of leg - per Dr. Timothy Lasso    Cor pulmonale, chronic - per Dr. Timothy Lasso    Pulmonary hypertension - per Dr. Timothy Lasso    Diastolic CHF with preserved left ventricular function, NYHA class 2 - will need diuresis once BP high enough to tolerate. For now, with sepsis and hypotension, she will be getting some fluid    Obstructive chronic bronchitis without exacerbation - per Dr. Timothy Lasso    MI (myocardial infarction) - Hopefully demand ischemia, not acute vessel closure. Continue to cycle enzymes, follow for symptoms.   SignedTheodore Demark, PA-C 2014/03/16 1:41 PM Beeper [phone]  Co-Sign MD

## 2014-02-14 NOTE — ED Notes (Signed)
Abnormal labs given to Dr. Yao 

## 2014-02-14 NOTE — Progress Notes (Signed)
CRITICAL VALUE ALERT  Critical value received:  Troponin 7.63  Date of notification:  13-Mar-2014  Time of notification:  1650  Critical value read back yes  Nurse who received alert:  Arman Bogus Rn  MD notified (1st page):  Dr Antoine Poche  Time of first page:  [phone]

## 2014-02-15 ENCOUNTER — Inpatient Hospital Stay (HOSPITAL_COMMUNITY): Payer: Medicare Other

## 2014-02-15 LAB — GLUCOSE, CAPILLARY
GLUCOSE-CAPILLARY: 145 mg/dL — AB (ref 70–99)
Glucose-Capillary: 165 mg/dL — ABNORMAL HIGH (ref 70–99)
Glucose-Capillary: 132 mg/dL — ABNORMAL HIGH (ref 70–99)
Glucose-Capillary: 156 mg/dL — ABNORMAL HIGH (ref 70–99)

## 2014-02-15 LAB — COMPREHENSIVE METABOLIC PANEL
ALT: 13 U/L (ref 0–35)
ANION GAP: 7 (ref 5–15)
AST: 51 U/L — ABNORMAL HIGH (ref 0–37)
Albumin: 2.6 g/dL — ABNORMAL LOW (ref 3.5–5.2)
Alkaline Phosphatase: 82 U/L (ref 39–117)
BILIRUBIN TOTAL: 1.6 mg/dL — AB (ref 0.3–1.2)
BUN: 43 mg/dL — ABNORMAL HIGH (ref 6–23)
CHLORIDE: 104 meq/L (ref 96–112)
CO2: 22 mmol/L (ref 19–32)
CREATININE: 1.78 mg/dL — AB (ref 0.50–1.10)
Calcium: 8.3 mg/dL — ABNORMAL LOW (ref 8.4–10.5)
GFR, EST AFRICAN AMERICAN: 33 mL/min — AB (ref >90)
GFR, EST NON AFRICAN AMERICAN: 29 mL/min — AB (ref >90)
GLUCOSE: 195 mg/dL — AB (ref 70–99)
Potassium: 4.2 mmol/L (ref 3.5–5.1)
Sodium: 133 mmol/L — ABNORMAL LOW (ref 135–145)
Total Protein: 7.1 g/dL (ref 6.0–8.3)

## 2014-02-15 LAB — SEDIMENTATION RATE: Sed Rate: 38 mm/hr — ABNORMAL HIGH (ref 0–22)

## 2014-02-15 LAB — CBC
HCT: 34.3 % — ABNORMAL LOW (ref 36.0–46.0)
Hemoglobin: 10.7 g/dL — ABNORMAL LOW (ref 12.0–15.0)
MCH: 26.6 pg (ref 26.0–34.0)
MCHC: 31.2 g/dL (ref 30.0–36.0)
MCV: 85.3 fL (ref 78.0–100.0)
PLATELETS: 284 10*3/uL (ref 150–400)
RBC: 4.02 MIL/uL (ref 3.87–5.11)
RDW: 19.2 % — AB (ref 11.5–15.5)
WBC: 15.8 10*3/uL — ABNORMAL HIGH (ref 4.0–10.5)

## 2014-02-15 LAB — PROTIME-INR
INR: 1.7 — AB (ref 0.00–1.49)
Prothrombin Time: 20.1 seconds — ABNORMAL HIGH (ref 11.6–15.2)

## 2014-02-15 LAB — HEPARIN LEVEL (UNFRACTIONATED)
HEPARIN UNFRACTIONATED: 0.22 [IU]/mL — AB (ref 0.30–0.70)
HEPARIN UNFRACTIONATED: 0.17 [IU]/mL — AB (ref 0.30–0.70)
Heparin Unfractionated: 0.35 IU/mL (ref 0.30–0.70)

## 2014-02-15 LAB — APTT: aPTT: 100 seconds — ABNORMAL HIGH (ref 24–37)

## 2014-02-15 LAB — HEMOGLOBIN A1C
Hgb A1c MFr Bld: 7.4 % — ABNORMAL HIGH (ref <5.7)
Mean Plasma Glucose: 166 mg/dL — ABNORMAL HIGH (ref <117)

## 2014-02-15 LAB — C-REACTIVE PROTEIN: CRP: 15.5 mg/dL — AB (ref <0.60)

## 2014-02-15 LAB — TROPONIN I: Troponin I: 7.01 ng/mL (ref <0.031)

## 2014-02-15 LAB — BRAIN NATRIURETIC PEPTIDE: B NATRIURETIC PEPTIDE 5: 829.6 pg/mL — AB (ref 0.0–100.0)

## 2014-02-15 MED ORDER — SODIUM CHLORIDE 0.9 % IV BOLUS (SEPSIS)
500.0000 mL | Freq: Once | INTRAVENOUS | Status: AC
  Administered 2014-02-15: 500 mL via INTRAVENOUS

## 2014-02-15 MED ORDER — HEPARIN (PORCINE) IN NACL 100-0.45 UNIT/ML-% IJ SOLN
2300.0000 [IU]/h | INTRAMUSCULAR | Status: DC
  Administered 2014-02-15 (×2): 1950 [IU]/h via INTRAVENOUS
  Administered 2014-02-16: 2150 [IU]/h via INTRAVENOUS
  Administered 2014-02-17 – 2014-02-18 (×2): 2300 [IU]/h via INTRAVENOUS
  Filled 2014-02-15 (×10): qty 250

## 2014-02-15 MED ORDER — INSULIN GLARGINE 100 UNIT/ML ~~LOC~~ SOLN
15.0000 [IU] | Freq: Every day | SUBCUTANEOUS | Status: DC
  Administered 2014-02-15 – 2014-02-18 (×4): 15 [IU] via SUBCUTANEOUS
  Filled 2014-02-15 (×5): qty 0.15

## 2014-02-15 NOTE — Progress Notes (Signed)
SUBJECTIVE: Pt says she feels better than yesterday. Still has some "chest congestion". Denies chest pain. Wants to eat. Admitted with sepsis. Blood cultures reportedly growing gram+ cocci in chains (based on Dr. Ferd Hibbs note). I called micro but there was no answer. I spoke with our lab and blood cultures are apparently sent out.     Intake/Output Summary (Last 24 hours) at 02/15/14 1142 Last data filed at 02/15/14 0900  Gross per 24 hour  Intake 2241.08 ml  Output      0 ml  Net 2241.08 ml    Current Facility-Administered Medications  Medication Dose Route Frequency Provider Last Rate Last Dose  . 0.9 %  sodium chloride infusion   Intravenous Continuous Gwen Pounds, MD 100 mL/hr at 02/15/14 0600    . acetaminophen (TYLENOL) tablet 650 mg  650 mg Oral Q6H PRN Gwen Pounds, MD       Or  . acetaminophen (TYLENOL) suppository 650 mg  650 mg Rectal Q6H PRN Gwen Pounds, MD      . albuterol (PROVENTIL) (2.5 MG/3ML) 0.083% nebulizer solution 3 mL  3 mL Inhalation Q6H PRN Gwen Pounds, MD      . ALPRAZolam Prudy Feeler) tablet 0.5 mg  0.5 mg Oral BID PRN Gwen Pounds, MD      . antiseptic oral rinse (CPC / CETYLPYRIDINIUM CHLORIDE 0.05%) solution 7 mL  7 mL Mouth Rinse BID Gwen Pounds, MD   7 mL at 02/15/14 0942  . digoxin (LANOXIN) tablet 0.0625 mg  0.0625 mg Oral Daily Gwen Pounds, MD   0.0625 mg at 02/15/14 1610  . diltiazem (CARDIZEM CD) 24 hr capsule 120 mg  120 mg Oral Daily Gwen Pounds, MD   120 mg at 02/15/14 0942  . gabapentin (NEURONTIN) capsule 600 mg  600 mg Oral BID Gwen Pounds, MD   600 mg at 02/15/14 0942  . heparin ADULT infusion 100 units/mL (25000 units/250 mL)  1,700 Units/hr Intravenous Continuous Lauren Bajbus, RPH 17 mL/hr at 02/15/14 0900 1,700 Units/hr at 02/15/14 0900  . hydrocerin (EUCERIN) cream 1 application  1 application Topical BID Gwen Pounds, MD   1 application at 02/15/14 [phone]  . HYDROcodone-acetaminophen (NORCO/VICODIN) 5-325 MG per tablet 1 tablet   1 tablet Oral Q8H PRN Gwen Pounds, MD   1 tablet at 02/15/14 0054  . insulin aspart (novoLOG) injection 0-15 Units  0-15 Units Subcutaneous TID WC Gwen Pounds, MD   3 Units at 02/15/14 [phone]  . insulin aspart (novoLOG) injection 0-5 Units  0-5 Units Subcutaneous QHS Gwen Pounds, MD   2 Units at 03/14/14 2238  . insulin glargine (LANTUS) injection 15 Units  15 Units Subcutaneous QHS Gwen Pounds, MD      . morphine 2 MG/ML injection 1 mg  1 mg Intravenous Q6H PRN Gwen Pounds, MD      . piperacillin-tazobactam (ZOSYN) IVPB 3.375 g  3.375 g Intravenous Q8H Gwen Pounds, MD   3.375 g at 02/15/14 0542  . senna (SENOKOT) tablet 8.6 mg  1 tablet Oral BID Gwen Pounds, MD   8.6 mg at 03/14/14 2236  . vancomycin (VANCOCIN) 1,250 mg in sodium chloride 0.9 % 250 mL IVPB  1,250 mg Intravenous Q12H Gwen Pounds, MD   1,250 mg at 02/15/14 0240    Filed Vitals:   02/15/14 0200 02/15/14 0400 02/15/14 0510 02/15/14 0700  BP: 96/56  Pulse: 71 82 81 75  Temp:  97.8 F (36.6 C)  97.3 F (36.3 C)  TempSrc:  Oral  Oral  Resp: Height:      Weight:      SpO2: 98% 95% 98% 98%    PHYSICAL EXAM General: NAD, poor dentition. HEENT: Poor dentition. Neck: Difficult to assess JVP, no thyromegaly.  Lungs: Diminished sounds b/l. CV: Irregular rhythm, normal rate, normal S1/S2, no S3. 2+ pitting pedal edema with stasis dermatitis and erythema.  Abdomen: Morbidly obese.  Neurologic: Alert and oriented x 3.  Psych: Normal affect. Musculoskeletal: No gross deformities. Extremities: No clubbing or cyanosis.   TELEMETRY: Reviewed telemetry pt in atrial fibrillation, HR controlled.  LABS: Basic Metabolic Panel:  Recent Labs  16/10/96 0951 02/15/14 0300  NA 134* 133*  K 4.8 4.2  CL 99 104  CO2 20 22  GLUCOSE 260* 195*  BUN 35* 43*  CREATININE 1.80* 1.78*  CALCIUM 8.7 8.3*   Liver Function Tests:  Recent Labs  03/04/2014 0951 02/15/14 0300  AST 44* 51*  ALT 11 13    ALKPHOS 101 82  BILITOT 2.5* 1.6*  PROT 8.0 7.1  ALBUMIN 2.9* 2.6*   No results for input(s): LIPASE, AMYLASE in the last 72 hours. CBC:  Recent Labs  02/22/2014 0951 02/15/14 0300  WBC 26.6* 15.8*  NEUTROABS 22.6*  --   HGB 12.3 10.7*  HCT 39.2 34.3*  MCV 85.6 85.3  PLT 361 284   Cardiac Enzymes:  Recent Labs  03/14/2014 1625 03/13/2014 2149 02/15/14 0300  CKTOTAL 355*  --   --   TROPONINI 7.63* 7.34* 7.01*   BNP: Invalid input(s): POCBNP D-Dimer: No results for input(s): DDIMER in the last 72 hours. Hemoglobin A1C: No results for input(s): HGBA1C in the last 72 hours. Fasting Lipid Panel: No results for input(s): CHOL, HDL, LDLCALC, TRIG, CHOLHDL, LDLDIRECT in the last 72 hours. Thyroid Function Tests: No results for input(s): TSH, T4TOTAL, T3FREE, THYROIDAB in the last 72 hours.  Invalid input(s): FREET3 Anemia Panel: No results for input(s): VITAMINB12, FOLATE, FERRITIN, TIBC, IRON, RETICCTPCT in the last 72 hours.  RADIOLOGY: Portable Chest 1 View  02/15/2014   CLINICAL DATA:  Shortness of breath.  Morbid obesity.  EXAM: PORTABLE CHEST - 1 VIEW  COMPARISON:  One day prior  FINDINGS: Support apparatus: None  Cardiomediastinal silhouette: Patient moderately rotated left. Cardiomegaly accentuated by AP portable technique.  Pleura: Right costophrenic angle minimally excluded. No gross pleural fluid. No pneumothorax.  Lungs: Low lung volumes with resultant pulmonary interstitial prominence. Improvement in mild interstitial edema. The left lung base is poorly evaluated. Otherwise, no areas of consolidation are identified.  Other: None  IMPRESSION: Improvement in mild congestive heart failure.  Moderately degraded exam, secondary to patient size and positioning.   Electronically Signed   By: Jeronimo Greaves M.D.   On: 02/15/2014 08:22   Dg Chest Port 1 View  03/13/2014   CLINICAL DATA:  Shortness of breath an rapid atrial fibrillation.  EXAM: PORTABLE CHEST - 1 VIEW  COMPARISON:   11/09/2013  FINDINGS: Stable cardiac enlargement. Diffuse interstitial edema present. No significant pleural fluid identified.  IMPRESSION: Cardiomegaly with diffuse interstitial edema.   Electronically Signed   By: Irish Lack M.D.   On: 02/24/2014 10:54      ASSESSMENT AND PLAN: Pt with history of pulmonary HTN, tobacco abuse, cor pulmonale secondary to COPD/morbid obesity with hypoventilation syndrome, atrial fibrillation, and diabetes admitted with sepsis with  leukocytosis and lactic acidosis, now growing gram + cocci in chains as per Dr. Ferd Hibbs note. I called micro but there was no answer. Spoke with our lab and apparently blood cultures are sent out. On admission in ED, noted to be in rapid atrial fibrillation with acute diastolic heart failure (EF 50-55%) and elevated troponin indicative of demand ischemia. Hypotensive on admission with SBP in 80's.  I had a lengthy discussion with Dr. Timothy Lasso on 1/2, her internist, who has done a remarkable job at keeping her out of the hospital given her numerous comorbidities. She had been relatively compliant with medical therapy until recently.  She is on vancomycin and Zosyn. After IV diltiazem bolus 20 mg in ED, HR decreased to 80 bpm range and has remained that way. On low-dose digoxin and long-acting diltiazem.  Needs IV fluid boluses for hypotension/sepsis. Would continue to hold Lasix and spironolactone to avoid precipitating further hypotension. O2 sats 95% on 2L, previously 86% on RA. No requirement for pressors at present. May eventually consider coronary angiography as she most certainly has CAD given multiple comorbidities and troponin elevation/demand ischemia. Troponins peaked at 7.63, now trending down. Continue heparin. Received ASA in ED. However, as she refused anticoagulation for atrial fibrillation in the past, I am not certain she would necessarily be compliant with dual antiplatelet therapy, or would even consider (or be a candidate)  for CABG given morbid obesity. Continue to monitor for worsening heart failure given IV fluid repletement necessity. Consider diuresis after BP has stabilized for some time. On vanco + Zosyn for sepsis, WBC decreasing, 26.6-->15.8. Overall prognosis is very poor.   Prentice Docker, M.D., F.A.C.C.

## 2014-02-15 NOTE — Progress Notes (Signed)
Rec'd call from [facility] in regards to + blood cultures. Gram + cocci in chains found in 2 anerobis bottles and 1 aerobic bottle. Dr Timothy Lasso paged. Also made aware of VS and lab results.

## 2014-02-15 NOTE — Progress Notes (Addendum)
ANTICOAGULATION CONSULT NOTE - Follow Up Consult  Pharmacy Consult for heparin Indication: atrial fibrillation  Allergies  Allergen Reactions  . Levaquin [Levofloxacin In D5w] Itching    Patient Measurements: Height: 5\' 5"  (165.1 cm) Weight: (!) 343 lb 14.7 oz (156 kg) IBW/kg (Calculated) : 57 Heparin Dosing Weight: 96kg  Vital Signs: Temp: 97.4 F (36.3 C) (01/03 1252) Temp Source: Oral (01/03 1252) BP: 86/50 mmHg (01/03 1252) Pulse Rate: 77 (01/03 1252)  Labs:  Recent Labs  03/06/2014 0951 02/13/2014 1625 02/24/2014 1841 02/21/2014 2149 02/15/14 0300 02/15/14 1210  HGB 12.3  --   --   --  10.7*  --   HCT 39.2  --   --   --  34.3*  --   PLT 361  --   --   --  284  --   APTT  --   --   --   --  100*  --   LABPROT  --   --   --   --  20.1*  --   INR  --   --   --   --  1.70*  --   HEPARINUNFRC  --   --  0.16*  --  0.35 0.22*  CREATININE 1.80*  --   --   --  1.78*  --   CKTOTAL  --  355*  --   --   --   --   TROPONINI  --  7.63*  --  7.34* 7.01*  --     Estimated Creatinine Clearance: 47.4 mL/min (by C-G formula based on Cr of 1.78).   Medications:  Heparin @ 1700 units/hr  Assessment: Joy Benitez brought in today with rapid AFib. Started on heparin- initial level low at 0.16 units/mL. RN did not receive information regarding issues with line or reasons for gtt to be held from day RN. No bleeding noted in chart.  Heparin level remains low this AM at 0.22.     Goal of Therapy:  Heparin level 0.3-0.7 units/ml Monitor platelets by anticoagulation protocol: Yes   Plan: -Increase heparin gtt to 1950 units/hr. -recheck heparin level in 6 hours -daily HL and CBC -follow for s/s bleeding  Tad Moore, BCPS  Clinical Pharmacist Pager [phone]  02/15/2014 1:41 PM    ADDENDUM Heparin level this evening low at 0.22 units/mL. No noted bleeding.  Plan: -increase heparin to 2150 units/hr -next HL with AM labs -daily HL and CBC -follow for s/s  bleeding  Deboraha Goar D. Mattisyn Cardona, PharmD, BCPS Clinical Pharmacist Pager: [phone] 02/15/2014 9:02 PM

## 2014-02-15 NOTE — Progress Notes (Signed)
ANTICOAGULATION CONSULT NOTE - Follow Up Consult  Pharmacy Consult for heparin Indication: atrial fibrillation  Allergies  Allergen Reactions  . Levaquin [Levofloxacin In D5w] Itching    Patient Measurements: Height: 5\' 5"  (165.1 cm) Weight: (!) 343 lb 14.7 oz (156 kg) IBW/kg (Calculated) : 57 Heparin Dosing Weight: 96kg  Vital Signs: Temp: 97.4 F (36.3 C) (01/02 2327) Temp Source: Oral (01/02 2327) BP: 92/59 mmHg (01/03 0054) Pulse Rate: 74 (01/03 0054)  Labs:  Recent Labs  02/21/2014 0951 02/18/2014 1625 02/17/2014 1841 02/15/2014 2149 02/15/14 0300  HGB 12.3  --   --   --  10.7*  HCT 39.2  --   --   --  34.3*  PLT 361  --   --   --  284  APTT  --   --   --   --  100*  LABPROT  --   --   --   --  20.1*  INR  --   --   --   --  1.70*  HEPARINUNFRC  --   --  0.16*  --  0.35  CREATININE 1.80*  --   --   --   --   CKTOTAL  --  355*  --   --   --   TROPONINI  --  7.63*  --  7.34*  --     Estimated Creatinine Clearance: 46.9 mL/min (by C-G formula based on Cr of 1.8).   Medications:  Heparin @ 1700 units/hr  Assessment: 68 YOF brought in today with rapid AFib. Started on heparin- initial level low at 0.16 units/mL. F/u HL this AM was therapeutic at 0.35. RN reports no s/s of bleeding.    Goal of Therapy:  Heparin level 0.3-0.7 units/ml Monitor platelets by anticoagulation protocol: Yes   Plan: -Continue heparin infusion at 1700 units/hr -recheck heparin level in 6 hours -daily HL and CBC -follow for s/s bleeding  Vinnie Level, PharmD., BCPS Clinical Pharmacist Pager [phone]

## 2014-02-15 NOTE — Progress Notes (Signed)
Subjective: Admitted 1/2 c cellulitis/Sepsis/Demand MI/Afib RVR/Hypotension/fever/DeH/AFTT and Leukocytosis. Trop I max @ 7 and remains on Hep gtt. Mental state much better this am. Blood Cxs already +.  Gram + cocci in chains found in 2 anerobis bottles and 1 aerobic bottle.  She remains on Broad Spectrum Abx. She knows she is very ill.  She is not suicidal but expresses doubt that she will recover adequately from this. Some pains - diffuse. Hungry and thirsty. S/p fluid boluses for soft BPs  Objective: Vital signs in last 24 hours: Temp:  [97.3 F (36.3 C)-101.4 F (38.6 C)] 97.3 F (36.3 C) (01/03 0700) Pulse Rate:  [31-151] 75 (01/03 0700) Resp:  [13-29] 19 (01/03 0700) BP: (83-133)/(42-107) 96/56 mmHg (01/03 0700) SpO2:  [92 %-99 %] 98 % (01/03 0700) Weight:  [156 kg (343 lb 14.7 oz)] 156 kg (343 lb 14.7 oz) (01/02 1532) Weight change:  Last BM Date: 02/15/14  CBG (last 3)   Recent Labs  02/18/14 1022 February 18, 2014 1640 02-18-2014 2148  GLUCAP 252* 218* 210*    Intake/Output from previous day:  Intake/Output Summary (Last 24 hours) at 02/15/14 0939 Last data filed at 02/15/14 0900  Gross per 24 hour  Intake 2241.08 ml  Output    250 ml  Net 1991.08 ml   01/02 0701 - 01/03 0700 In: 1890.1 [I.V.:1590.1; IV Piggyback:300] Out: 250 [Urine:250]   Physical Exam  General appearance: Sick appearing. Ill OP - Dry.  Poor Dentition Neck: no adenopathy, no carotid bruit,  Hard to assess JVD thyroid not enlarged, symmetric, no tenderness/mass/nodules Resp: Distant Cardio: Irreg/rate controlled GI: Obese, non tender. Extremities: 2-3 + Edema, Venosus stasis, cellulitis, Excoriations, tender. BPulses:1- 2+ and symmetric Lymph nodes: no cervical lymphadenopathy Neurologic: more awake and more alert.  Interactive  Lab Results:  Recent Labs  02/18/2014 0951 02/15/14 0300  NA 134* 133*  K 4.8 4.2  CL 99 104  CO2 20 22  GLUCOSE 260* 195*  BUN 35* 43*  CREATININE  1.80* 1.78*  CALCIUM 8.7 8.3*     Recent Labs  2014-02-18 0951 02/15/14 0300  AST 44* 51*  ALT 11 13  ALKPHOS 101 82  BILITOT 2.5* 1.6*  PROT 8.0 7.1  ALBUMIN 2.9* 2.6*     Recent Labs  02-18-14 0951 02/15/14 0300  WBC 26.6* 15.8*  NEUTROABS 22.6*  --   HGB 12.3 10.7*  HCT 39.2 34.3*  MCV 85.6 85.3  PLT 361 284    Lab Results  Component Value Date   INR 1.70* 02/15/2014   INR 1.74* 04/22/2010   INR 1.59* 04/21/2010     Recent Labs  18-Feb-2014 1625 02-18-2014 2149 02/15/14 0300  CKTOTAL 355*  --   --   TROPONINI 7.63* 7.34* 7.01*    No results for input(s): TSH, T4TOTAL, T3FREE, THYROIDAB in the last 72 hours.  Invalid input(s): FREET3  No results for input(s): VITAMINB12, FOLATE, FERRITIN, TIBC, IRON, RETICCTPCT in the last 72 hours.  Micro Results: Recent Results (from the past 240 hour(s))  MRSA PCR Screening     Status: None   Collection Time: 02/18/14  4:21 PM  Result Value Ref Range Status   MRSA by PCR NEGATIVE NEGATIVE Final    Comment:        The GeneXpert MRSA Assay (FDA approved for NASAL specimens only), is one component of a comprehensive MRSA colonization surveillance program. It is not intended to diagnose MRSA infection nor to guide or monitor treatment for MRSA infections.  Studies/Results: Portable Chest 1 View  02/15/2014   CLINICAL DATA:  Shortness of breath.  Morbid obesity.  EXAM: PORTABLE CHEST - 1 VIEW  COMPARISON:  One day prior  FINDINGS: Support apparatus: None  Cardiomediastinal silhouette: Patient moderately rotated left. Cardiomegaly accentuated by AP portable technique.  Pleura: Right costophrenic angle minimally excluded. No gross pleural fluid. No pneumothorax.  Lungs: Low lung volumes with resultant pulmonary interstitial prominence. Improvement in mild interstitial edema. The left lung base is poorly evaluated. Otherwise, no areas of consolidation are identified.  Other: None  IMPRESSION: Improvement in mild  congestive heart failure.  Moderately degraded exam, secondary to patient size and positioning.   Electronically Signed   By: Jeronimo Greaves M.D.   On: 02/15/2014 08:22   Dg Chest Port 1 View  03/07/2014   CLINICAL DATA:  Shortness of breath an rapid atrial fibrillation.  EXAM: PORTABLE CHEST - 1 VIEW  COMPARISON:  11/09/2013  FINDINGS: Stable cardiac enlargement. Diffuse interstitial edema present. No significant pleural fluid identified.  IMPRESSION: Cardiomegaly with diffuse interstitial edema.   Electronically Signed   By: Irish Lack M.D.   On: 02/17/2014 10:54     Medications: Scheduled: . antiseptic oral rinse  7 mL Mouth Rinse BID  . digoxin  0.0625 mg Oral Daily  . diltiazem  120 mg Oral Daily  . gabapentin  600 mg Oral BID  . hydrocerin  1 application Topical BID  . insulin aspart  0-15 Units Subcutaneous TID WC  . insulin aspart  0-5 Units Subcutaneous QHS  . insulin glargine  10 Units Subcutaneous QHS  . piperacillin-tazobactam (ZOSYN)  IV  3.375 g Intravenous Q8H  . senna  1 tablet Oral BID  . vancomycin  1,250 mg Intravenous Q12H   Continuous: . sodium chloride 100 mL/hr at 02/15/14 0600  . heparin 1,700 Units/hr (02/15/14 0900)     Assessment/Plan: Principal Problem:   Sepsis Active Problems:   Atrial fibrillation with tachycardic ventricular rate   Type 2 diabetes, uncontrolled, with neuropathy   Morbid obesity   Cellulitis and abscess of leg   Cor pulmonale, chronic   Pulmonary hypertension   Diastolic CHF with preserved left ventricular function, NYHA class 2   Obstructive chronic bronchitis without exacerbation   MI (myocardial infarction)  SEPSIS = Cellulitis/Sepsis/AMS/Demand MI/Afib RVR/Hypotension/fever/DeH/AFTT/Very Elevated Lactaic Acid and Leukocytosis. Gram + cocci in chains Bacteremia - get TTE to R/out Endocarditis.  May need TEE? Or just 4 weeks Abx +.  Will consider ID consult. Continue Vanco/Zosyn. Continue fluid resuscitation to keep SBP @  90+. Try and hold on Pressors Recheck Blood Cx later today and whenever she has fevers.  Leukocytosis up to 26.6 in ED- down to 15.8 currently c Broad Spectrum Abx.    Acute Demand MI c peak Trop I 7.63and elevated PRO-BNP secondary to Sepsis and presumed underlying CAD. Conservative management. May need CATH after Acute issues resolve but c Cr 1.8 we would need to be careful. Needs fluids for Sepsis and may make her Vol Overload worse. current BNP is 829.  Sats stable on supplemental O2.  Continue hep gtt  AFIb RVR - Cardizem IV one - PO cardizem if HR and BP tolerate.  HR currently fine. Continue low dose Dig. Dig level 0.5. She has refused Coumadin several in the past as she was on it for a little while and come off on her own. She has also refused the newer agents. Dr Graciela Husbands consulted.   CHF - Last ECHO 11/05/13  c/w HFpEF - EF 50-55%-Abnormal septal motion. The cavity size was normal. Wall thickness was increased in a pattern of severe LVH. RA and RV Mod Dilated; PA peak pressure: 43 mm Hg. Diastolic/R Sided c recurring Anasarca although recent Ab [place] in March 2015 showed Hepatomegaly with suspected hepatic steatosis/Prior cholecystectomy/No ascites. Hold Lasix/Aldactone as BP too low to tolerate  Severe Pulmonary HTN/Cor Pulmonale from Obesity Hypoventilation and COPD. She has not wanted formal sleep study and CPA/BiPAP use  Morbid obesity - Goal Wt is < 300. She fluctuates up and down 50#s depending on Edema.  Due to chronic illness she also has Prot Cal Malnutrition - Low Albumin - needs wt loss but to be able to maintain muscle mass. Consult nutrition when some better  COPD/Smoker - Needs to stay quit forever - Provide inhalers/Nebs prn Hypertension - Now Hypotensive due to sepsis. Try to restore BP Anemia - Hbg at 12.3 - 10.7 post fluids. Will follow Chronic Low back pain/Degenerative disk disease/Gait Instability/Fall Risk/Chronic OA - Walker. Pain meds (they  unfortunately lower BP DM2 Neuropathy. - Gabapentin Chronic Venous insufficiency with chronic venous stasis c lower extremity edema.    Type II or unspecified type diabetes mellitus with neurological manifestations, uncontrolled- insulin as able- Lantus/Novolog- Hold Metformin c current lactic acidosis   DM2 -  Recent Labs  02/18/14 1022 Feb 18, 2014 1640 02/18/14 2148  GLUCAP 252* 218* 210*   ID -  Anti-infectives    Start     Dose/Rate Route Frequency Ordered Stop   02/15/14 1200  vancomycin (VANCOCIN) 2,500 mg in sodium chloride 0.9 % 500 mL IVPB  Status:  Discontinued     2,500 mg250 mL/hr over 120 Minutes Intravenous Every 24 hours 02-18-14 1555 02/18/2014 1608   02/15/14 0200  vancomycin (VANCOCIN) 1,250 mg in sodium chloride 0.9 % 250 mL IVPB     1,250 mg166.7 mL/hr over 90 Minutes Intravenous Every 12 hours 02-18-14 1412     02-18-14 2200  piperacillin-tazobactam (ZOSYN) IVPB 3.375 g     3.375 g12.5 mL/hr over 240 Minutes Intravenous Every 8 hours 2014/02/18 1412     02/18/2014 1800  piperacillin-tazobactam (ZOSYN) IVPB 3.375 g  Status:  Discontinued     3.375 g100 mL/hr over 30 Minutes Intravenous 4 times per day 2014/02/18 1555 February 18, 2014 1608   2014/02/18 1145  piperacillin-tazobactam (ZOSYN) IVPB 3.375 g     3.375 g100 mL/hr over 30 Minutes Intravenous  Once 02/18/14 1130 18-Feb-2014 1238   2014-02-18 1145  vancomycin (VANCOCIN) 2,500 mg in sodium chloride 0.9 % 500 mL IVPB     2,500 mg250 mL/hr over 120 Minutes Intravenous  Once 02-18-14 1130 02-18-14 1405     DNR.  She states that she does not know if she is strong enough to recover.  She stated she is ready for the other side.  She says she is not suicidal though.  She thinks she is being realistic.  We discussed stopping active treatments.  She did not seem to want that and I did not feel that comfortable either.  Will get Palliative care to help explore these issues and help define goals of care.  She does have a poor  prognosis.  Continue Stepdown, IV Abx, Lasix and aldactone held, Cardizem prn and add back diet as she has less AMS. Continue Dig Low dose. Get 2D echo    LOS: 1 day   Dacey Milberger M 02/15/2014, 9:39 AM

## 2014-02-16 DIAGNOSIS — Z515 Encounter for palliative care: Secondary | ICD-10-CM

## 2014-02-16 DIAGNOSIS — R531 Weakness: Secondary | ICD-10-CM

## 2014-02-16 DIAGNOSIS — F411 Generalized anxiety disorder: Secondary | ICD-10-CM

## 2014-02-16 DIAGNOSIS — I214 Non-ST elevation (NSTEMI) myocardial infarction: Secondary | ICD-10-CM

## 2014-02-16 DIAGNOSIS — I369 Nonrheumatic tricuspid valve disorder, unspecified: Secondary | ICD-10-CM

## 2014-02-16 LAB — URINE CULTURE: Colony Count: >=100000

## 2014-02-16 LAB — GLUCOSE, CAPILLARY
GLUCOSE-CAPILLARY: 123 mg/dL — AB (ref 70–99)
GLUCOSE-CAPILLARY: 124 mg/dL — AB (ref 70–99)
Glucose-Capillary: 122 mg/dL — ABNORMAL HIGH (ref 70–99)
Glucose-Capillary: 114 mg/dL — ABNORMAL HIGH (ref 70–99)

## 2014-02-16 LAB — CBC
HCT: 33.9 % — ABNORMAL LOW (ref 36.0–46.0)
Hemoglobin: 10.3 g/dL — ABNORMAL LOW (ref 12.0–15.0)
MCH: 25.8 pg — ABNORMAL LOW (ref 26.0–34.0)
MCHC: 30.4 g/dL (ref 30.0–36.0)
MCV: 84.8 fL (ref 78.0–100.0)
Platelets: 294 10*3/uL (ref 150–400)
RBC: 4 MIL/uL (ref 3.87–5.11)
RDW: 19.3 % — AB (ref 11.5–15.5)
WBC: 13.8 10*3/uL — ABNORMAL HIGH (ref 4.0–10.5)

## 2014-02-16 LAB — HEPARIN LEVEL (UNFRACTIONATED)
HEPARIN UNFRACTIONATED: 0.51 [IU]/mL (ref 0.30–0.70)
Heparin Unfractionated: 0.24 IU/mL — ABNORMAL LOW (ref 0.30–0.70)

## 2014-02-16 MED ORDER — ATORVASTATIN CALCIUM 80 MG PO TABS
80.0000 mg | ORAL_TABLET | Freq: Every day | ORAL | Status: DC
  Administered 2014-02-16 – 2014-02-18 (×3): 80 mg via ORAL
  Filled 2014-02-16 (×6): qty 1

## 2014-02-16 MED ORDER — METOPROLOL TARTRATE 12.5 MG HALF TABLET
12.5000 mg | ORAL_TABLET | Freq: Two times a day (BID) | ORAL | Status: DC
  Filled 2014-02-16 (×2): qty 1

## 2014-02-16 MED ORDER — ASPIRIN 81 MG PO CHEW
81.0000 mg | CHEWABLE_TABLET | Freq: Every day | ORAL | Status: DC
  Administered 2014-02-16 – 2014-02-18 (×3): 81 mg via ORAL
  Filled 2014-02-16 (×6): qty 1

## 2014-02-16 MED ORDER — NYSTATIN 100000 UNIT/GM EX CREA
TOPICAL_CREAM | Freq: Two times a day (BID) | CUTANEOUS | Status: DC
  Administered 2014-02-16: 10:00:00 via TOPICAL
  Administered 2014-02-16: 1 via TOPICAL
  Administered 2014-02-17 – 2014-02-19 (×5): via TOPICAL
  Administered 2014-02-20: 1 via TOPICAL
  Filled 2014-02-16: qty 15

## 2014-02-16 NOTE — Progress Notes (Signed)
ANTICOAGULATION CONSULT NOTE - Follow Up Consult  Pharmacy Consult for heparin Indication: atrial fibrillation  Allergies  Allergen Reactions  . Levaquin [Levofloxacin In D5w] Itching    Patient Measurements: Height: 5\' 5"  (165.1 cm) Weight: (!) 367 lb (166.47 kg) IBW/kg (Calculated) : 57 Heparin Dosing Weight: 96kg  Vital Signs: Temp: 97.6 F (36.4 C) (01/04 0330) Temp Source: Oral (01/04 0330) BP: 92/58 mmHg (01/04 0445) Pulse Rate: 90 (01/04 0445)  Labs:  Recent Labs  2014-03-07 0951 03-07-14 1625  2014/03/07 2149 02/15/14 0300 02/15/14 1210 02/15/14 1948 02/16/14 0349  HGB 12.3  --   --   --  10.7*  --   --  10.3*  HCT 39.2  --   --   --  34.3*  --   --  33.9*  PLT 361  --   --   --  284  --   --  294  APTT  --   --   --   --  100*  --   --   --   LABPROT  --   --   --   --  20.1*  --   --   --   INR  --   --   --   --  1.70*  --   --   --   HEPARINUNFRC  --   --   < >  --  0.35 0.22* 0.17* 0.24*  CREATININE 1.80*  --   --   --  1.78*  --   --   --   CKTOTAL  --  355*  --   --   --   --   --   --   TROPONINI  --  7.63*  --  7.34* 7.01*  --   --   --   < > = values in this interval not displayed.  Estimated Creatinine Clearance: 49.5 mL/min (by C-G formula based on Cr of 1.78).   Medications:  Heparin @ 2150 units/hr  Assessment: 68 YOF brought in today with rapid AFib. Started on heparin. Heparin level remains low this AM at 0.24 but has trended up. H/H remains low but stable. Plt wnl. No s/s of bleeding per RN.   Goal of Therapy:  Heparin level 0.3-0.7 units/ml Monitor platelets by anticoagulation protocol: Yes   Plan: -Increase heparin infusion to 2300 units/hr  -F/u 6 hour HL  -Monitor daily HL, CBC and s/s of bleeding.   Vinnie Level, PharmD., BCPS Clinical Pharmacist Pager [phone]

## 2014-02-16 NOTE — Consult Note (Signed)
Patient Joy Benitez      DOB: 10-18-1945      NMM:[id number]     Consult Note from the Palliative Medicine Team at [facility]    Consult Requested by: Dr. Timothy Lasso     PCP: Gwen Pounds, MD Reason for Consultation: GOC - short term and long term goals. Phone Number:[phone]  Assessment of patients Current state: I spoke today with [name] who is very open about her life and troubles. She tells me about her husband of 32 years who "has a good heart" but they have had many troubles. She also tells me that she has had many "dark times in life." She is very tearful as she tells me that she has been in worse health since her daughter died ~1 year ago (sudden and unexpected). Her family is clearly important to her. She tells me many times that her son wants her to hold on to live to know his son that is yet to be born. She tells me she doesn't want to die but seems to be accepting of this outcome. She tells me today that she feels that she has some fight left in her - she knows that she has a lot of work to improve her health and that it is not easy. She realizes that she has an overall poor prognosis and that her body may limit her progress but she is wanting to try. I do believe that she has been depressed from the death of her daughter. She appreciates having someone to talk to listen to her. She is also appreciative of Dr. Ferd Hibbs care of her.    Goals of Care: 1.  Code Status: DNR   4. Disposition: To be determined on outcomes.    3. Symptom Management:   1. Anxiety: Continue Xanax 0.5 mg BID prn. May need something scheduled qhs to help with sleep.  2. Sleep disturbance: Likely related to anxiety.  3. Depression since death of daughter: 08-01-22 consider antidepressant if needed.  4. Generalized Weakness: She was walking independently in her home with a walker PTA. Consider PT. Continue medical management.  4. 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 She also tells me that she sometimes hears her daughter as she is falling asleep.   5. Spiritual: She is a spiritual person and references being a christian and believing in God.     Brief HPI: 69 yo female admitted with fever, confusion, fall r/t cellulitis sepsis with bacteremia (gram + cocci) and demand NSTEMI (unable to complete cath at this time d/t renal insufficiency). Also found to have Afib RVR, hypotension, dehydration, failure to thrive. Currently smoking 1 ppd.    ROS: + anxiety, + sleep disturbance    PMH:  Past Medical History  Diagnosis Date  . Atrial fibrillation   . Hypertension   . History of blood transfusion 1975    "w/my daughter's birth"  . Chronic diastolic CHF (congestive heart failure), NYHA class 2   . Type II diabetes mellitus   . Anemia   . Anxiety   . Arthritis     "hips down" (11/06/2013)  . Diabetic peripheral neuropathy      PSH: Past Surgical History  Procedure Laterality Date  . Cholecystectomy  1999  . Incision and drainage abscess  2002    "in my groin"  .  Cesarean section  1975; 1985  . Cataract extraction w/ intraocular lens implant Left 2013   I have reviewed the FH and SH and  If appropriate update it with new information. Allergies  Allergen Reactions  . Levaquin [Levofloxacin In D5w] Itching   Scheduled Meds: . antiseptic oral rinse  7 mL Mouth Rinse BID  . aspirin  81 mg Oral Daily  . atorvastatin  80 mg Oral q1800  . digoxin  0.0625 mg Oral Daily  . gabapentin  600 mg Oral BID  . hydrocerin  1 application Topical BID  . insulin aspart  0-15 Units Subcutaneous TID WC  . insulin aspart  0-5 Units Subcutaneous QHS  . insulin glargine  15 Units Subcutaneous QHS  . [START ON 02/17/2014] metoprolol tartrate  12.5 mg Oral BID  . nystatin cream   Topical BID  . piperacillin-tazobactam (ZOSYN)  IV  3.375 g Intravenous Q8H   . senna  1 tablet Oral BID  . vancomycin  1,250 mg Intravenous Q12H   Continuous Infusions: . heparin 2,300 Units/hr (02/16/14 0800)   PRN Meds:.acetaminophen **OR** acetaminophen, albuterol, ALPRAZolam, HYDROcodone-acetaminophen, morphine injection    BP 87/73 mmHg  Pulse 100  Temp(Src) 97.5 F (36.4 C) (Axillary)  Resp 15  Ht  (1.651 m)  Wt 166.47 kg (367 lb)  BMI 61.07 kg/m2  SpO2 93%   PPS: 30%   Intake/Output Summary (Last 24 hours) at 02/16/14 1527 Last data filed at 02/16/14 1300  Gross per 24 hour  Intake 2856.22 ml  Output      0 ml  Net 2856.22 ml   LBM: 02/15/13  Physical Exam:  General: NAD, obese, ill appearing HEENT: [place]/AT, poor dentition, no JVD, moist mucous membranes Chest: No labored breathing, symmetric CVS: Irreg, HR 70s Abdomen: Soft, obese, NT Ext: MAE, BLE erythema/edema 2+ with cellulitis Neuro: Awake, alert, oriented x 3  Labs: CBC    Component Value Date/Time   WBC 13.8* 02/16/2014 0349   RBC 4.00 02/16/2014 0349   RBC 4.05 11/04/2013 2304   HGB 10.3* 02/16/2014 0349   HCT 33.9* 02/16/2014 0349   PLT 294 02/16/2014 0349   MCV 84.8 02/16/2014 0349   MCH 25.8* 02/16/2014 0349   MCHC 30.4 02/16/2014 0349   RDW 19.3* 02/16/2014 0349   LYMPHSABS 1.9 03/11/2014 0951   MONOABS 2.1* 03/09/2014 0951   EOSABS 0.0 03/08/2014 0951   BASOSABS 0.0 02/26/2014 0951    BMET    Component Value Date/Time   NA 133* 02/15/2014 0300   K 4.2 02/15/2014 0300   CL 104 02/15/2014 0300   CO2 22 02/15/2014 0300   GLUCOSE 195* 02/15/2014 0300   BUN 43* 02/15/2014 0300   CREATININE 1.78* 02/15/2014 0300   CALCIUM 8.3* 02/15/2014 0300   GFRNONAA 29* 02/15/2014 0300   GFRAA 33* 02/15/2014 0300    CMP     Component Value Date/Time   NA 133* 02/15/2014 0300   K 4.2 02/15/2014 0300   CL 104 02/15/2014 0300   CO2 22 02/15/2014 0300   GLUCOSE 195* 02/15/2014 0300   BUN 43* 02/15/2014 0300   CREATININE 1.78* 02/15/2014 0300   CALCIUM  8.3* 02/15/2014 0300   PROT 7.1 02/15/2014 0300   ALBUMIN 2.6* 02/15/2014 0300   AST 51* 02/15/2014 0300   ALT 13 02/15/2014 0300   ALKPHOS 82 02/15/2014 0300   BILITOT 1.6* 02/15/2014 0300   GFRNONAA 29* 02/15/2014 0300   GFRAA 33* 02/15/2014 0300    Time In Time  Out Total Time Spent with Patient Total Overall Time  1500 1630     Greater than 50%  of this time was spent counseling and coordinating care related to the above assessment and plan.  Yong Channel, NP Palliative Medicine Team Pager # [phone] (M-F 8a-5p) Team Phone # [phone] (Nights/Weekends)

## 2014-02-16 NOTE — Progress Notes (Signed)
Subjective: Admitted 1/2 c Cellulitis/Sepsis/Demand MI/Afib RVR/Hypotension/fever/DeH/AFTT and Leukocytosis. Trop I max @ 7 and remains on Hep gtt. Mental state better Getting Xanax. BPs remain soft Blood Cxs = Gram + cocci in chains found in 2 anerobis bottles and 1 aerobic bottle.  She remains on [employer] Abx. Bariatric bed Foley in place Skin fold Fungal infections noted She knows she is very ill.  She is not suicidal but expresses doubt that she will recover adequately from this. Some diffuse pains (-) for MRSA swab HA yesterday better. 3L Inez FIO2  I tried to motivate her as she is bored and frustrated over her health  Objective: Vital signs in last 24 hours: Temp:  [97.4 F (36.3 C)-97.6 F (36.4 C)] 97.6 F (36.4 C) (01/04 0330) Pulse Rate:  [72-96] 90 (01/04 0445) Resp:  [11-31] 20 (01/04 0445) BP: (86-99)/(50-73) 92/58 mmHg (01/04 0445) SpO2:  [94 %-100 %] 97 % (01/04 0445) Weight:  [166.47 kg (367 lb)] 166.47 kg (367 lb) (01/04 0330) Weight change: 10.47 kg (23 lb 1.3 oz) Last BM Date: 02/15/14  CBG (last 3)   Recent Labs  02/15/14 1247 02/15/14 1654 02/15/14 2136  GLUCAP 132* 145* 156*    Intake/Output from previous day:  Intake/Output Summary (Last 24 hours) at 02/16/14 0706 Last data filed at 02/16/14 0659  Gross per 24 hour  Intake 3405.89 ml  Output      0 ml  Net 3405.89 ml   01/03 0701 - 01/04 0700 In: 3405.9 [P.O.:120; I.V.:2885.9; IV Piggyback:400] Out: -    Physical Exam  General appearance: Sick appearing. Ill OP - Dry.  Poor Dentition Neck: no adenopathy, no carotid bruit,  Hard to assess JVD thyroid not enlarged, symmetric, no tenderness/mass/nodules Resp: Distant, Diminished all over Cardio: Irreg/rate controlled GI: Obese, non tender. Extremities: 2-3 + Edema, Venosus stasis, cellulitis improving, Excoriations, tender. BPulses:1- 2+ and symmetric Lymph nodes: no cervical lymphadenopathy Neurologic: more awake and  more alert.  Interactive  Lab Results:  Recent Labs  February 24, 2014 0951 02/15/14 0300  NA 134* 133*  K 4.8 4.2  CL 99 104  CO2 20 22  GLUCOSE 260* 195*  BUN 35* 43*  CREATININE 1.80* 1.78*  CALCIUM 8.7 8.3*     Recent Labs  2014/02/24 0951 02/15/14 0300  AST 44* 51*  ALT 11 13  ALKPHOS 101 82  BILITOT 2.5* 1.6*  PROT 8.0 7.1  ALBUMIN 2.9* 2.6*     Recent Labs  February 24, 2014 0951 02/15/14 0300 02/16/14 0349  WBC 26.6* 15.8* 13.8*  NEUTROABS 22.6*  --   --   HGB 12.3 10.7* 10.3*  HCT 39.2 34.3* 33.9*  MCV 85.6 85.3 84.8  PLT 361 284 294    Lab Results  Component Value Date   INR 1.70* 02/15/2014   INR 1.74* 04/22/2010   INR 1.59* 04/21/2010     Recent Labs  02/24/2014 1625 February 24, 2014 2149 02/15/14 0300  CKTOTAL 355*  --   --   TROPONINI 7.63* 7.34* 7.01*    No results for input(s): TSH, T4TOTAL, T3FREE, THYROIDAB in the last 72 hours.  Invalid input(s): FREET3  No results for input(s): VITAMINB12, FOLATE, FERRITIN, TIBC, IRON, RETICCTPCT in the last 72 hours.  Micro Results: Recent Results (from the past 240 hour(s))  Blood culture (routine x 2)     Status: None (Preliminary result)   Collection Time: 2014/02/24  9:51 AM  Result Value Ref Range Status   Specimen Description BLOOD RIGHT ARM  Final   Special  Requests BOTTLES DRAWN AEROBIC AND ANAEROBIC 3CC  Final   Culture   Final    GRAM POSITIVE COCCI IN CHAINS Note: Gram Stain Report Called to,Read Back By and Verified With: Ara Kussmaul RN on 02/15/14 at 06:28 by Christie Nottingham Performed at [facility]    Report Status PENDING  Incomplete  Blood culture (routine x 2)     Status: None (Preliminary result)   Collection Time: February 22, 2014 10:33 AM  Result Value Ref Range Status   Specimen Description BLOOD LEFT ARM  Final   Special Requests BOTTLES DRAWN AEROBIC AND ANAEROBIC 10CC  Final   Culture   Final    GRAM POSITIVE COCCI IN CHAINS Note: Gram Stain Report Called to,Read Back By and Verified  With: Ara Kussmaul RN on 02/15/14 at 06:28 by Christie Nottingham Performed at [facility]    Report Status PENDING  Incomplete  Urine culture     Status: None (Preliminary result)   Collection Time: 2014-02-22 11:09 AM  Result Value Ref Range Status   Specimen Description URINE, CATHETERIZED  Final   Special Requests NONE  Final   Colony Count   Final    >=100,000 COLONIES/ML Performed at [facility]    Culture   Final    PROTEUS MIRABILIS Performed at [facility]    Report Status PENDING  Incomplete  MRSA PCR Screening     Status: None   Collection Time: 02-22-2014  4:21 PM  Result Value Ref Range Status   MRSA by PCR NEGATIVE NEGATIVE Final    Comment:        The GeneXpert MRSA Assay (FDA approved for NASAL specimens only), is one component of a comprehensive MRSA colonization surveillance program. It is not intended to diagnose MRSA infection nor to guide or monitor treatment for MRSA infections.      Studies/Results: Portable Chest 1 View  02/15/2014   CLINICAL DATA:  Shortness of breath.  Morbid obesity.  EXAM: PORTABLE CHEST - 1 VIEW  COMPARISON:  One day prior  FINDINGS: Support apparatus: None  Cardiomediastinal silhouette: Patient moderately rotated left. Cardiomegaly accentuated by AP portable technique.  Pleura: Right costophrenic angle minimally excluded. No gross pleural fluid. No pneumothorax.  Lungs: Low lung volumes with resultant pulmonary interstitial prominence. Improvement in mild interstitial edema. The left lung base is poorly evaluated. Otherwise, no areas of consolidation are identified.  Other: None  IMPRESSION: Improvement in mild congestive heart failure.  Moderately degraded exam, secondary to patient size and positioning.   Electronically Signed   By: Jeronimo Greaves M.D.   On: 02/15/2014 08:22   Dg Chest Port 1 View  02-22-14   CLINICAL DATA:  Shortness of breath an rapid atrial fibrillation.  EXAM: PORTABLE CHEST - 1 VIEW   COMPARISON:  11/09/2013  FINDINGS: Stable cardiac enlargement. Diffuse interstitial edema present. No significant pleural fluid identified.  IMPRESSION: Cardiomegaly with diffuse interstitial edema.   Electronically Signed   By: Irish Lack M.D.   On: February 22, 2014 10:54     Medications: Scheduled: . antiseptic oral rinse  7 mL Mouth Rinse BID  . digoxin  0.0625 mg Oral Daily  . diltiazem  120 mg Oral Daily  . gabapentin  600 mg Oral BID  . hydrocerin  1 application Topical BID  . insulin aspart  0-15 Units Subcutaneous TID WC  . insulin aspart  0-5 Units Subcutaneous QHS  . insulin glargine  15 Units Subcutaneous QHS  . piperacillin-tazobactam (ZOSYN)  IV  3.375 g Intravenous Q8H  . senna  1 tablet Oral BID  . vancomycin  1,250 mg Intravenous Q12H   Continuous: . sodium chloride 100 mL/hr at 02/16/14 0400  . heparin 2,300 Units/hr (02/16/14 0659)     Assessment/Plan: Principal Problem:   Sepsis Active Problems:   Atrial fibrillation with tachycardic ventricular rate   Type 2 diabetes, uncontrolled, with neuropathy   Morbid obesity   Cellulitis and abscess of leg   Cor pulmonale, chronic   Pulmonary hypertension   Diastolic CHF with preserved left ventricular function, NYHA class 2   Obstructive chronic bronchitis without exacerbation   MI (myocardial infarction)  SEPSIS = Cellulitis/Sepsis/AMS/Demand MI/Afib RVR/Hypotension/fever/DeH/AFTT/Very Elevated Lactaic Acid and Leukocytosis. Gram + cocci in chains Bacteremia - get TTE to R/out Endocarditis.  May need TEE? Or just 4 weeks Abx +.  Will consider ID consult. Continue Vanco/Zosyn. Continue fluid resuscitation to keep SBP @ 90+. Try and hold on Pressors Recheck Blood Cx sent and whenever she has fevers.  Leukocytosis up to 26.6 in ED- down to 15.8 - 13.8 currently c Broad Spectrum Abx.    Acute Demand MI c peak Trop I 7.63 and elevated PRO-BNP secondary to Sepsis and presumed underlying CAD. Conservative  management. May need CATH after Acute issues resolve but c Cr 1.8 we would need to be careful. Needs fluids for Sepsis and may make her Vol Overload worse. current BNP is 829.  Sats stable on supplemental O2.  Continue hep gtt  AFIb RVR - Cardizem IV one - PO cardizem if HR and BP tolerate.  HR currently fine. Continue low dose Dig. Dig level 0.5. She has refused Coumadin several in the past as she was on it for a little while and come off on her own. She has also refused the newer agents. Dr Graciela Husbands consulted.   CHF - Last ECHO 11/05/13 c/w HFpEF - EF 50-55%-Abnormal septal motion. The cavity size was normal. Wall thickness was increased in a pattern of severe LVH. RA and RV Mod Dilated; PA peak pressure: 43 mm Hg. Diastolic/R Sided c recurring Anasarca although recent Ab [place] in March 2015 showed Hepatomegaly with suspected hepatic steatosis/Prior cholecystectomy/No ascites. Hold Lasix/Aldactone as BP remains too low to tolerate  Severe Pulmonary HTN/Cor Pulmonale from Obesity Hypoventilation and COPD. She has not wanted formal sleep study and CPA/BiPAP use  Morbid obesity - Goal Wt is < 300. She fluctuates up and down 50#s depending on Edema.  Wt now 71 - she has gained lots of weight!  Due to chronic illness she also has Prot Cal Malnutrition - Low Albumin - needs wt loss but to be able to maintain muscle mass. Consult nutrition when some better  COPD/Smoker - Needs to stay quit forever - Provide inhalers/Nebs prn Hypertension - Now Hypotensive due to sepsis. Try to restore BP Anemia - Hbg at 12.3 - 10.7 - 10.3 post fluids. Will follow Chronic Low back pain/Degenerative disk disease/Gait Instability/Fall Risk/Chronic OA - Walker. Pain meds (they unfortunately lower BP).  High Fall risk.  Will need PT when some better DM2 Neuropathy. - Gabapentin Chronic Venous insufficiency with chronic venous stasis c lower extremity edema.   Fungal Dermatitis - Nystatin  Type II or unspecified  type diabetes mellitus with neurological manifestations, uncontrolled- insulin as able- Lantus/Novolog- Hold Metformin c current lactic acidosis -   Recent Labs  02/15/14 1247 02/15/14 1654 02/15/14 2136  GLUCAP 132* 145* 156*   ID -  Anti-infectives    Start  Dose/Rate Route Frequency Ordered Stop   02/15/14 1200  vancomycin (VANCOCIN) 2,500 mg in sodium chloride 0.9 % 500 mL IVPB  Status:  Discontinued     2,500 mg250 mL/hr over 120 Minutes Intravenous Every 24 hours 03-11-2014 1555 March 11, 2014 1608   02/15/14 0200  vancomycin (VANCOCIN) 1,250 mg in sodium chloride 0.9 % 250 mL IVPB     1,250 mg166.7 mL/hr over 90 Minutes Intravenous Every 12 hours 2014/03/11 1412     03/11/2014 2200  piperacillin-tazobactam (ZOSYN) IVPB 3.375 g     3.375 g12.5 mL/hr over 240 Minutes Intravenous Every 8 hours 03/11/14 1412     03-11-2014 1800  piperacillin-tazobactam (ZOSYN) IVPB 3.375 g  Status:  Discontinued     3.375 g100 mL/hr over 30 Minutes Intravenous 4 times per day Mar 11, 2014 1555 11-Mar-2014 1608   03/11/2014 1145  piperacillin-tazobactam (ZOSYN) IVPB 3.375 g     3.375 g100 mL/hr over 30 Minutes Intravenous  Once 03-11-2014 1130 2014-03-11 1238   03-11-2014 1145  vancomycin (VANCOCIN) 2,500 mg in sodium chloride 0.9 % 500 mL IVPB     2,500 mg250 mL/hr over 120 Minutes Intravenous  Once 2014/03/11 1130 2014-03-11 1405     DNR.  She states that she does not know if she is strong enough to recover.  She stated she is ready for death.  She is not suicidal though.  Palliative care consulted but I am under the understanding that she turned them away. She does have a poor prognosis.  Continue Stepdown, IV Abx, Lasix and aldactone held, Cardizem prn and continue diet. Continue Dig Low dose. Get 2D echo    LOS: 2 days   Misti Towle M 02/16/2014, 7:06 AM

## 2014-02-16 NOTE — Progress Notes (Signed)
Patient Name: Joy Benitez Date of Encounter: 02/16/2014   Principal Problem:   Sepsis Active Problems:   Atrial fibrillation with tachycardic ventricular rate   Morbid obesity   NSTEMI (non-ST elevated myocardial infarction)   Type 2 diabetes, uncontrolled, with neuropathy   Cellulitis and abscess of leg   Cor pulmonale, chronic   Pulmonary hypertension   Diastolic CHF with preserved left ventricular function, NYHA class 2   Obstructive chronic bronchitis without exacerbation   Smoker   SUBJECTIVE  Remains in rate controlled afib.  Breathing stable though she says it isn't any better.  No chest pain.  BP's remain soft (cuff on left wrist however - ? Accuracy).  CURRENT MEDS . antiseptic oral rinse  7 mL Mouth Rinse BID  . digoxin  0.0625 mg Oral Daily  . diltiazem  120 mg Oral Daily  . gabapentin  600 mg Oral BID  . hydrocerin  1 application Topical BID  . insulin aspart  0-15 Units Subcutaneous TID WC  . insulin aspart  0-5 Units Subcutaneous QHS  . insulin glargine  15 Units Subcutaneous QHS  . nystatin cream   Topical BID  . piperacillin-tazobactam (ZOSYN)  IV  3.375 g Intravenous Q8H  . senna  1 tablet Oral BID  . vancomycin  1,250 mg Intravenous Q12H    OBJECTIVE  Filed Vitals:   02/16/14 0156 02/16/14 0330 02/16/14 0445 02/16/14 0745  BP: 98/63  Pulse: 96 80 90 75  Temp:  97.6 F (36.4 C)  97.4 F (36.3 C)  TempSrc:  Oral  Oral  Resp:  Height:   (1.651 m)    Weight:  367 lb (166.47 kg)    SpO2: 95% 96% 97% 96%    Intake/Output Summary (Last 24 hours) at 02/16/14 0850 Last data filed at 02/16/14 0659  Gross per 24 hour  Intake 3205.89 ml  Output      0 ml  Net 3205.89 ml   Filed Weights   03/15/2014 1532 02/16/14 0330  Weight: 343 lb 14.7 oz (156 kg) 367 lb (166.47 kg)    PHYSICAL EXAM  General: Pleasant, NAD. Neuro: Alert and oriented X 3. Moves all extremities spontaneously. Psych: Normal affect. HEENT:   Normal  Neck: Supple without bruits.  Unable to assess JVP 2/2 girth. Lungs:  Resp regular and unlabored, diminished breath sounds @ bilat bases, scattered rhonchi. Heart: IR, IR, distant, no s3, s4, or murmurs. Abdomen: Soft, non-tender, non-distended, BS + x 4.  Extremities: Lower legs erythematous bilat.  Trace bilat LEE.  No clubbing, cyanosis. Dime sized ulceration noted to dorsal surface of left foot.  Accessory Clinical Findings  CBC  Recent Labs  2014-03-15 0951 02/15/14 0300 02/16/14 0349  WBC 26.6* 15.8* 13.8*  NEUTROABS 22.6*  --   --   HGB 12.3 10.7* 10.3*  HCT 39.2 34.3* 33.9*  MCV 85.6 85.3 84.8  PLT 361 284 294   Basic Metabolic Panel  Recent Labs  15-Mar-2014 0951 02/15/14 0300  NA 134* 133*  K 4.8 4.2  CL 99 104  CO2 20 22  GLUCOSE 260* 195*  BUN 35* 43*  CREATININE 1.80* 1.78*  CALCIUM 8.7 8.3*   Liver Function Tests  Recent Labs  15-Mar-2014 0951 02/15/14 0300  AST 44* 51*  ALT 11 13  ALKPHOS 101 82  BILITOT 2.5* 1.6*  PROT 8.0 7.1  ALBUMIN 2.9* 2.6*   Cardiac Enzymes  Recent Labs  15-Mar-2014 1625  02/25/2014 2149 02/15/14 0300  CKTOTAL 355*  --   --   TROPONINI 7.63* 7.34* 7.01*   Hemoglobin A1C  Recent Labs  03/05/2014 1625  HGBA1C 7.4*   TELE  Afib, 70's to 80's.   Radiology/Studies  Portable Chest 1 View  02/15/2014   CLINICAL DATA:  Shortness of breath.  Morbid obesity.  EXAM: PORTABLE CHEST - 1 VIEW  COMPARISON:  One day prior  IMPRESSION: Improvement in mild congestive heart failure.  Moderately degraded exam, secondary to patient size and positioning.   Electronically Signed   By: Jeronimo Greaves M.D.   On: 02/15/2014 08:22   ASSESSMENT AND PLAN  1.  Sepsis with lower extremity cellulitis:  Afebrile.  WBC coming down.  Abx per IM.  2.  NSTEMI:  Troponin peaked @ 7.63.  No chest pain.  Breathing stable.  Given co-morbidities including DM, she likely has CAD though is currently a poor candidate for cath 2/2 # 1 with ongoing renal  insufficiency (though creat was nl in 02/21/2013).  She has not had any chest pain.  Cont conservative mgmt for now - Cont heparin, add ASA/Statin.  No BB 2/2 ongoing relative hypotension.  Consider myoview vs cath once clinically improved r/t #1 and dependent upon renal fxn.  Echo pending.  3.  Afib:  Rate controlled on dilt 120 and digoxin 0.0625 daily - was on both @ home.  BP has been soft and she may not be able to tolerate dilt going forward, though prev tolerated @ home.  Will have to be careful with digoxin in setting of renal insufficiency.  Cont heparin.  Add ASA.  She has refused OAC in the past will need to reconsider following ischemic evaluation.  4.  Acute Kidney Injury:  In setting of sepsis/hypotension.  Slightly improved.  Follow.  Home diuretics on hold.  5.  COPD/Tob Abuse/Pulm HTN (? OSA/OHS):  No active wheezing.  Albuterol ordered PRN.  Needs to quit smoking.  6.  DM:  Per IM.  7.  Morbid Obesity:  Likely the root of her multiple issues.  Would benefit from aggressive nutritional counseling going forward.  Signed, Nicolasa Ducking NP  As above, patient seen and examined. Patient denies dyspnea or chest pain this morning. Continue antibiotics for cellulitis per primary care. She has ruled in for a non-ST elevation myocardial infarction. Continue aspirin, heparin and statin. Discontinue Cardizem and treated with metoprolol 12.5 mg twice a day as tolerated by blood pressure. Given overall medical condition (morbid obesity, cellulitis, pulmonary hypertension, renal insuff, limited mobility) I do not think she is a candidate for cardiac catheterization at this point. We can consider in the future if she makes significant improvement. She remains in atrial fibrillation. I will try to control heart rate with metoprolol and low-dose digoxin. Continue heparin. She would benefit from anticoagulation long-term and would consider apixaban if she will agree. She apparently has declined this  in the past. She is markedly volume overloaded on examination most likely related to right heart failure from her morbid obesity, obstructive sleep apnea and obesity hypoventilation syndrome.  Discontinue IV fluids. Resume diuretics later as BP improves. Patient's long-term prognosis appears to be poor. Olga Millers

## 2014-02-16 NOTE — Progress Notes (Signed)
ANTICOAGULATION CONSULT NOTE - Follow Up Consult  Pharmacy Consult for heparin Indication: atrial fibrillation  Allergies  Allergen Reactions  . Levaquin [Levofloxacin In D5w] Itching    Patient Measurements: Height: 5\' 5"  (165.1 cm) Weight: (!) 367 lb (166.47 kg) IBW/kg (Calculated) : 57 Heparin Dosing Weight: 96kg  Vital Signs: Temp: 97.5 F (36.4 C) (01/04 1200) Temp Source: Axillary (01/04 1200) BP: 87/73 mmHg (01/04 1200) Pulse Rate: 100 (01/04 1200)  Labs:  Recent Labs  02/24/2014 0951 02/21/2014 1625  03/04/2014 2149 02/15/14 0300  02/15/14 1948 02/16/14 0349 02/16/14 1334  HGB 12.3  --   --   --  10.7*  --   --  10.3*  --   HCT 39.2  --   --   --  34.3*  --   --  33.9*  --   PLT 361  --   --   --  284  --   --  294  --   APTT  --   --   --   --  100*  --   --   --   --   LABPROT  --   --   --   --  20.1*  --   --   --   --   INR  --   --   --   --  1.70*  --   --   --   --   HEPARINUNFRC  --   --   < >  --  0.35  < > 0.17* 0.24* 0.51  CREATININE 1.80*  --   --   --  1.78*  --   --   --   --   CKTOTAL  --  355*  --   --   --   --   --   --   --   TROPONINI  --  7.63*  --  7.34* 7.01*  --   --   --   --   < > = values in this interval not displayed.  Estimated Creatinine Clearance: 49.5 mL/min (by C-G formula based on Cr of 1.78).   Medications:  Heparin @ 2150 units/hr  Assessment: 68 YOF brought in today with rapid AFib. Started on heparin. H/H remains low but stable. Plt wnl. No s/s of bleeding per RN.  Heparin level this PM is therapeutic on 2300 units/hr.   Goal of Therapy:  Heparin level 0.3-0.7 units/ml Monitor platelets by anticoagulation protocol: Yes   Plan: Continue heparin infusion to 2300 units/hr  Monitor daily HL, CBC and s/s of bleeding.   Link Snuffer, PharmD, BCPS Clinical Pharmacist [phone] 02/16/2014, 2:52 PM

## 2014-02-16 NOTE — Progress Notes (Signed)
  Echocardiogram 2D Echocardiogram has been performed.  Joy Benitez M 02/16/2014, 3:04 PM

## 2014-02-17 ENCOUNTER — Inpatient Hospital Stay (HOSPITAL_COMMUNITY): Payer: Medicare Other

## 2014-02-17 DIAGNOSIS — L97509 Non-pressure chronic ulcer of other part of unspecified foot with unspecified severity: Secondary | ICD-10-CM

## 2014-02-17 DIAGNOSIS — L0390 Cellulitis, unspecified: Secondary | ICD-10-CM

## 2014-02-17 DIAGNOSIS — E11621 Type 2 diabetes mellitus with foot ulcer: Secondary | ICD-10-CM

## 2014-02-17 DIAGNOSIS — N179 Acute kidney failure, unspecified: Secondary | ICD-10-CM | POA: Insufficient documentation

## 2014-02-17 DIAGNOSIS — F411 Generalized anxiety disorder: Secondary | ICD-10-CM

## 2014-02-17 DIAGNOSIS — R6520 Severe sepsis without septic shock: Secondary | ICD-10-CM

## 2014-02-17 DIAGNOSIS — A419 Sepsis, unspecified organism: Secondary | ICD-10-CM | POA: Insufficient documentation

## 2014-02-17 DIAGNOSIS — B954 Other streptococcus as the cause of diseases classified elsewhere: Secondary | ICD-10-CM

## 2014-02-17 DIAGNOSIS — B951 Streptococcus, group B, as the cause of diseases classified elsewhere: Secondary | ICD-10-CM

## 2014-02-17 DIAGNOSIS — N189 Chronic kidney disease, unspecified: Secondary | ICD-10-CM

## 2014-02-17 LAB — COMPREHENSIVE METABOLIC PANEL
ALBUMIN: 2.6 g/dL — AB (ref 3.5–5.2)
ALT: 17 U/L (ref 0–35)
AST: 39 U/L — AB (ref 0–37)
Alkaline Phosphatase: 98 U/L (ref 39–117)
Anion gap: 9 (ref 5–15)
BUN: 49 mg/dL — ABNORMAL HIGH (ref 6–23)
CO2: 21 mmol/L (ref 19–32)
CREATININE: 1.87 mg/dL — AB (ref 0.50–1.10)
Calcium: 8.3 mg/dL — ABNORMAL LOW (ref 8.4–10.5)
Chloride: 103 mEq/L (ref 96–112)
GFR calc Af Amer: 31 mL/min — ABNORMAL LOW (ref >90)
GFR calc non Af Amer: 27 mL/min — ABNORMAL LOW (ref >90)
Glucose, Bld: 132 mg/dL — ABNORMAL HIGH (ref 70–99)
Potassium: 4.3 mmol/L (ref 3.5–5.1)
Sodium: 133 mmol/L — ABNORMAL LOW (ref 135–145)
TOTAL PROTEIN: 7.4 g/dL (ref 6.0–8.3)
Total Bilirubin: 1.7 mg/dL — ABNORMAL HIGH (ref 0.3–1.2)

## 2014-02-17 LAB — CULTURE, BLOOD (ROUTINE X 2)

## 2014-02-17 LAB — CBC
HCT: 35.5 % — ABNORMAL LOW (ref 36.0–46.0)
Hemoglobin: 10.9 g/dL — ABNORMAL LOW (ref 12.0–15.0)
MCH: 26.1 pg (ref 26.0–34.0)
MCHC: 30.7 g/dL (ref 30.0–36.0)
MCV: 84.9 fL (ref 78.0–100.0)
Platelets: 311 10*3/uL (ref 150–400)
RBC: 4.18 MIL/uL (ref 3.87–5.11)
RDW: 19.4 % — ABNORMAL HIGH (ref 11.5–15.5)
WBC: 12.8 10*3/uL — ABNORMAL HIGH (ref 4.0–10.5)

## 2014-02-17 LAB — GLUCOSE, CAPILLARY
GLUCOSE-CAPILLARY: 132 mg/dL — AB (ref 70–99)
Glucose-Capillary: 152 mg/dL — ABNORMAL HIGH (ref 70–99)
Glucose-Capillary: 122 mg/dL — ABNORMAL HIGH (ref 70–99)
Glucose-Capillary: 138 mg/dL — ABNORMAL HIGH (ref 70–99)

## 2014-02-17 LAB — LIPID PANEL
Cholesterol: 117 mg/dL (ref 0–200)
HDL: 14 mg/dL — AB (ref >39)
LDL CALC: 69 mg/dL (ref 0–99)
Total CHOL/HDL Ratio: 8.4 RATIO
Triglycerides: 168 mg/dL — ABNORMAL HIGH (ref <150)
VLDL: 34 mg/dL (ref 0–40)

## 2014-02-17 LAB — HEPARIN LEVEL (UNFRACTIONATED): Heparin Unfractionated: 0.42 IU/mL (ref 0.30–0.70)

## 2014-02-17 MED ORDER — CEFTRIAXONE SODIUM IN DEXTROSE 40 MG/ML IV SOLN
2.0000 g | INTRAVENOUS | Status: DC
  Administered 2014-02-17 – 2014-02-20 (×4): 2 g via INTRAVENOUS
  Filled 2014-02-17 (×6): qty 50

## 2014-02-17 MED ORDER — TRAZODONE HCL 50 MG PO TABS
50.0000 mg | ORAL_TABLET | Freq: Every day | ORAL | Status: DC
  Administered 2014-02-17: 50 mg via ORAL
  Filled 2014-02-17 (×2): qty 1

## 2014-02-17 MED ORDER — METOPROLOL TARTRATE 12.5 MG HALF TABLET
12.5000 mg | ORAL_TABLET | Freq: Two times a day (BID) | ORAL | Status: DC
  Administered 2014-02-17: 12.5 mg via ORAL
  Filled 2014-02-17 (×2): qty 1

## 2014-02-17 MED ORDER — COLLAGENASE 250 UNIT/GM EX OINT
TOPICAL_OINTMENT | Freq: Every day | CUTANEOUS | Status: DC
  Administered 2014-02-17: 16:00:00 via TOPICAL
  Administered 2014-02-18: 1 via TOPICAL
  Administered 2014-02-19 – 2014-02-21 (×3): via TOPICAL
  Filled 2014-02-17: qty 30

## 2014-02-17 MED ORDER — METOPROLOL SUCCINATE ER 25 MG PO TB24
25.0000 mg | ORAL_TABLET | Freq: Every day | ORAL | Status: DC
  Administered 2014-02-17 – 2014-02-18 (×2): 25 mg via ORAL
  Filled 2014-02-17 (×5): qty 1

## 2014-02-17 NOTE — Progress Notes (Signed)
Utilization Review Completed.Joy Benitez T1/06/2014  

## 2014-02-17 NOTE — Progress Notes (Signed)
Patient Name: Joy Benitez Date of Encounter: 02/17/2014   Principal Problem:   Sepsis Active Problems:   Atrial fibrillation with tachycardic ventricular rate   Type 2 diabetes, uncontrolled, with neuropathy   Morbid obesity   Cellulitis and abscess of leg   Cor pulmonale, chronic   Pulmonary hypertension   Diastolic CHF with preserved left ventricular function, NYHA class 2   Obstructive chronic bronchitis without exacerbation   Smoker   NSTEMI (non-ST elevated myocardial infarction)   Palliative care encounter   Anxiety state   General weakness   SUBJECTIVE  Mild dyspnea; no chest pain  CURRENT MEDS . antiseptic oral rinse  7 mL Mouth Rinse BID  . aspirin  81 mg Oral Daily  . atorvastatin  80 mg Oral q1800  . cefTRIAXone (ROCEPHIN)  IV  2 g Intravenous Q24H  . digoxin  0.0625 mg Oral Daily  . gabapentin  600 mg Oral BID  . hydrocerin  1 application Topical BID  . insulin aspart  0-15 Units Subcutaneous TID WC  . insulin aspart  0-5 Units Subcutaneous QHS  . insulin glargine  15 Units Subcutaneous QHS  . metoprolol tartrate  12.5 mg Oral BID  . nystatin cream   Topical BID  . senna  1 tablet Oral BID  . traZODone  50 mg Oral QHS    OBJECTIVE  Filed Vitals:   02/17/14 0500 02/17/14 0600 02/17/14 0810 02/17/14 0949  BP:   114/78 114/78  Pulse: 83 81 71 82  Temp:   97.3 F (36.3 C)   TempSrc:   Oral   Resp: Height:      Weight:      SpO2: 93% 89% 96%     Intake/Output Summary (Last 24 hours) at 02/17/14 1214 Last data filed at 02/17/14 0600  Gross per 24 hour  Intake   1314 ml  Output      0 ml  Net   1314 ml   Filed Weights   03/09/2014 1532 02/16/14 0330  Weight: 343 lb 14.7 oz (156 kg) 367 lb (166.47 kg)    PHYSICAL EXAM  General: Pleasant, NAD. Morbidly obese Neuro: Grossly nonfocal HEENT:  Normal  Neck: Supple Lungs:  Mild rhonchi Heart: Irregular Abdomen: Soft, non-tender, non-distended; abdominal wall  edema Extremities: Lower legs erythematous bilat.  1+ bilat LEE. Dime sized ulceration noted to dorsal surface of left foot.  Accessory Clinical Findings  CBC  Recent Labs  02/16/14 0349 02/17/14 0309  WBC 13.8* 12.8*  HGB 10.3* 10.9*  HCT 33.9* 35.5*  MCV 84.8 84.9  PLT 294 311   Basic Metabolic Panel  Recent Labs  02/15/14 0300 02/17/14 0309  NA 133* 133*  K 4.2 4.3  CL 104 103  CO2 22 21  GLUCOSE 195* 132*  BUN 43* 49*  CREATININE 1.78* 1.87*  CALCIUM 8.3* 8.3*   Liver Function Tests  Recent Labs  02/15/14 0300 02/17/14 0309  AST 51* 39*  ALT 13 17  ALKPHOS 82 98  BILITOT 1.6* 1.7*  PROT 7.1 7.4  ALBUMIN 2.6* 2.6*   Cardiac Enzymes  Recent Labs  02/27/2014 1625 02/21/2014 2149 02/15/14 0300  CKTOTAL 355*  --   --   TROPONINI 7.63* 7.34* 7.01*   Hemoglobin A1C  Recent Labs  03/11/2014 1625  HGBA1C 7.4*   TELE  Afib rate controlled  Radiology/Studies  Portable Chest 1 View  02/15/2014   CLINICAL DATA:  Shortness of breath.  Morbid  obesity.  EXAM: PORTABLE CHEST - 1 VIEW  COMPARISON:  One day prior  IMPRESSION: Improvement in mild congestive heart failure.  Moderately degraded exam, secondary to patient size and positioning.   Electronically Signed   By: Jeronimo Greaves M.D.   On: 02/15/2014 08:22   ASSESSMENT AND PLAN  1.  Sepsis with lower extremity cellulitis:  Afebrile.  WBC coming down.  Abx per IM.  2.  NSTEMI:  Troponin peaked @ 7.63.  No chest pain.  Breathing stable.  Given co-morbidities including DM, she likely has CAD though is currently a poor candidate for cath given multiple comorbidities including renal insuff, infection, morbid obesity, limited mobility.  Cont conservative mgmt for now - Cont heparin, ASA/Statin; change metoprolol to toprol. EF significantly reduced; no ACEI due to hypotension and renal insuff. Consider myoview vs cath if she improves.  3.  Afib:  Rate controlled on metoprolol and digoxin 0.0625 daily.  Cont heparin  and ASA.  She has refused OAC in the past will need to reconsider at time of DC.  4.  Acute Kidney Injury:  Diuretics on hold for now; will resume later.  5.  COPD/Tob Abuse/Pulm HTN (? OSA/OHS):  No active wheezing.  Albuterol ordered PRN.  Needs to quit smoking.  6.  DM:  Per IM.  7.  Morbid Obesity:  Likely the root of her multiple issues.  Would benefit from aggressive nutritional counseling going forward.  Signed, Olga Millers

## 2014-02-17 NOTE — Consult Note (Signed)
[facility]    Date of Admission:  02/28/2014  Date of Consult:  02/17/2014  Reason for Consult: Group G streptococcus bacteremia in patient with recurrent cellulitis and diabetic foot ulcer Referring Physician: Dr. Virgina Jock   HPI: Joy Benitez is an 69 y.o. female with past mental history significant for atrial fibrillation, Morbid obesity, COPD, smokering  chronic congestive heart failure, poorly controlled diabetes mellitus with peripheral neuropathy and diabetic foot ulcer, recurrent lower extremity cellulitis who is admitted with septic shock due to progressive cellulitis involving her left lower extremity.   She came in with septic shock, AF w RVR, NTEMI  Her blood cultures essentially grown group G streptococcus from multiple blood cultures.  She states she has chronic lower extremity skin changes but that her left leg became grossly a red edematous and edematous in the days prior to admission to the hospital   Past Medical History  Diagnosis Date  . Atrial fibrillation   . Hypertension   . History of blood transfusion 1975    "w/my daughter's birth"  . Chronic diastolic CHF (congestive heart failure), NYHA class 2   . Type II diabetes mellitus   . Anemia   . Anxiety   . Arthritis     "hips down" (11/06/2013)  . Diabetic peripheral neuropathy     Past Surgical History  Procedure Laterality Date  . Cholecystectomy  1999  . Incision and drainage abscess  2002    "in my groin"  . Cesarean section  1975; 1985  . Cataract extraction w/ intraocular lens implant Left 2013  ergies:   Allergies  Allergen Reactions  . Levaquin [Levofloxacin In D5w] Itching     Medications: I have reviewed patients current medications as documented in Epic Anti-infectives    Start     Dose/Rate Route Frequency Ordered Stop   02/17/14 1000  cefTRIAXone (ROCEPHIN) 2 g in dextrose 5 % 50 mL IVPB - Premix     2 g100 mL/hr over 30 Minutes Intravenous Every 24  hours 02/17/14 0907     02/15/14 1200  vancomycin (VANCOCIN) 2,500 mg in sodium chloride 0.9 % 500 mL IVPB  Status:  Discontinued     2,500 mg250 mL/hr over 120 Minutes Intravenous Every 24 hours 03/13/2014 1555 02/24/2014 1608   02/15/14 0200  vancomycin (VANCOCIN) 1,250 mg in sodium chloride 0.9 % 250 mL IVPB  Status:  Discontinued     1,250 mg166.7 mL/hr over 90 Minutes Intravenous Every 12 hours 03/14/2014 1412 02/17/14 0907   02/21/2014 2200  piperacillin-tazobactam (ZOSYN) IVPB 3.375 g  Status:  Discontinued     3.375 g12.5 mL/hr over 240 Minutes Intravenous Every 8 hours 02/15/2014 1412 02/17/14 0907   03/15/2014 1800  piperacillin-tazobactam (ZOSYN) IVPB 3.375 g  Status:  Discontinued     3.375 g100 mL/hr over 30 Minutes Intravenous 4 times per day 03/11/2014 1555 02/25/2014 1608   02/20/2014 1145  piperacillin-tazobactam (ZOSYN) IVPB 3.375 g     3.375 g100 mL/hr over 30 Minutes Intravenous  Once 02/15/2014 1130 03/13/2014 1238   03/03/2014 1145  vancomycin (VANCOCIN) 2,500 mg in sodium chloride 0.9 % 500 mL IVPB     2,500 mg250 mL/hr over 120 Minutes Intravenous  Once 02/13/2014 1130 02/13/2014 1405      Social History:  reports that she has been smoking Cigarettes.  She has a 12 pack-year smoking history. She has never used smokeless tobacco. She reports that she does not drink alcohol or use illicit  drugs.  Family History  Problem Relation Age of Onset  . Heart attack Father     As in HPI and primary teams notes otherwise 12 point review of systems is negative  Blood pressure 99/49, pulse 81, temperature 97.4 F (36.3 C), temperature source Oral, resp. rate 25, height $RemoveBe'5\' 5"'qzYYvsIeS$  (1.651 m), weight 367 lb (166.47 kg), SpO2 93 %. General: Alert and awake, oriented x3, not in any acute distress. HEENT: EOMI, oropharynx clear and without exudate CVS irr, irr rate, distant heart sounds Chest: fairly clear to auscultation bilaterally, no wheezing, rales or rhonchi Abdomen: soft nontender, protuberant Extremities:  Chronic venous stasis changes ulceration over her left dorsal foot where she has increased erythema and edema. See pictures  02/17/13:            Neuro: nonfocal    Results for orders placed or performed during the hospital encounter of 03/02/2014 (from the past 48 hour(s))  Glucose, capillary     Status: Abnormal   Collection Time: 02/15/14 12:47 PM  Result Value Ref Range   Glucose-Capillary 132 (H) 70 - 99 mg/dL  Glucose, capillary     Status: Abnormal   Collection Time: 02/15/14  4:54 PM  Result Value Ref Range   Glucose-Capillary 145 (H) 70 - 99 mg/dL  Heparin level (unfractionated)     Status: Abnormal   Collection Time: 02/15/14  7:48 PM  Result Value Ref Range   Heparin Unfractionated 0.17 (L) 0.30 - 0.70 IU/mL    Comment:        IF HEPARIN RESULTS ARE BELOW EXPECTED VALUES, AND PATIENT DOSAGE HAS BEEN CONFIRMED, SUGGEST FOLLOW UP TESTING OF ANTITHROMBIN III LEVELS.   Glucose, capillary     Status: Abnormal   Collection Time: 02/15/14  9:36 PM  Result Value Ref Range   Glucose-Capillary 156 (H) 70 - 99 mg/dL  CBC     Status: Abnormal   Collection Time: 02/16/14  3:49 AM  Result Value Ref Range   WBC 13.8 (H) 4.0 - 10.5 K/uL   RBC 4.00 3.87 - 5.11 MIL/uL   Hemoglobin 10.3 (L) 12.0 - 15.0 g/dL   HCT 33.9 (L) 36.0 - 46.0 %   MCV 84.8 78.0 - 100.0 fL   MCH 25.8 (L) 26.0 - 34.0 pg   MCHC 30.4 30.0 - 36.0 g/dL   RDW 19.3 (H) 11.5 - 15.5 %   Platelets 294 150 - 400 K/uL  Heparin level (unfractionated)     Status: Abnormal   Collection Time: 02/16/14  3:49 AM  Result Value Ref Range   Heparin Unfractionated 0.24 (L) 0.30 - 0.70 IU/mL    Comment:        IF HEPARIN RESULTS ARE BELOW EXPECTED VALUES, AND PATIENT DOSAGE HAS BEEN CONFIRMED, SUGGEST FOLLOW UP TESTING OF ANTITHROMBIN III LEVELS.   Glucose, capillary     Status: Abnormal   Collection Time: 02/16/14  7:43 AM  Result Value Ref Range   Glucose-Capillary 123 (H) 70 - 99 mg/dL   Comment 1 Documented  in Chart    Comment 2 Notify RN   Glucose, capillary     Status: Abnormal   Collection Time: 02/16/14  1:03 PM  Result Value Ref Range   Glucose-Capillary 124 (H) 70 - 99 mg/dL  Heparin level (unfractionated)     Status: None   Collection Time: 02/16/14  1:34 PM  Result Value Ref Range   Heparin Unfractionated 0.51 0.30 - 0.70 IU/mL    Comment:  IF HEPARIN RESULTS ARE BELOW EXPECTED VALUES, AND PATIENT DOSAGE HAS BEEN CONFIRMED, SUGGEST FOLLOW UP TESTING OF ANTITHROMBIN III LEVELS.   Glucose, capillary     Status: Abnormal   Collection Time: 02/16/14  5:27 PM  Result Value Ref Range   Glucose-Capillary 122 (H) 70 - 99 mg/dL   Comment 1 Documented in Chart    Comment 2 Notify RN   Glucose, capillary     Status: Abnormal   Collection Time: 02/16/14  9:38 PM  Result Value Ref Range   Glucose-Capillary 114 (H) 70 - 99 mg/dL  CBC     Status: Abnormal   Collection Time: 02/17/14  3:09 AM  Result Value Ref Range   WBC 12.8 (H) 4.0 - 10.5 K/uL   RBC 4.18 3.87 - 5.11 MIL/uL   Hemoglobin 10.9 (L) 12.0 - 15.0 g/dL   HCT 35.5 (L) 36.0 - 46.0 %   MCV 84.9 78.0 - 100.0 fL   MCH 26.1 26.0 - 34.0 pg   MCHC 30.7 30.0 - 36.0 g/dL   RDW 19.4 (H) 11.5 - 15.5 %   Platelets 311 150 - 400 K/uL  Heparin level (unfractionated)     Status: None   Collection Time: 02/17/14  3:09 AM  Result Value Ref Range   Heparin Unfractionated 0.42 0.30 - 0.70 IU/mL    Comment:        IF HEPARIN RESULTS ARE BELOW EXPECTED VALUES, AND PATIENT DOSAGE HAS BEEN CONFIRMED, SUGGEST FOLLOW UP TESTING OF ANTITHROMBIN III LEVELS.   Comprehensive metabolic panel     Status: Abnormal   Collection Time: 02/17/14  3:09 AM  Result Value Ref Range   Sodium 133 (L) 135 - 145 mmol/L    Comment: Please note change in reference range.   Potassium 4.3 3.5 - 5.1 mmol/L    Comment: Please note change in reference range.   Chloride 103 96 - 112 mEq/L   CO2 21 19 - 32 mmol/L   Glucose, Bld 132 (H) 70 - 99 mg/dL    BUN 49 (H) 6 - 23 mg/dL   Creatinine, Ser 1.87 (H) 0.50 - 1.10 mg/dL   Calcium 8.3 (L) 8.4 - 10.5 mg/dL   Total Protein 7.4 6.0 - 8.3 g/dL   Albumin 2.6 (L) 3.5 - 5.2 g/dL   AST 39 (H) 0 - 37 U/L   ALT 17 0 - 35 U/L   Alkaline Phosphatase 98 39 - 117 U/L   Total Bilirubin 1.7 (H) 0.3 - 1.2 mg/dL   GFR calc non Af Amer 27 (L) >90 mL/min   GFR calc Af Amer 31 (L) >90 mL/min    Comment: (NOTE) The eGFR has been calculated using the CKD EPI equation. This calculation has not been validated in all clinical situations. eGFR's persistently <90 mL/min signify possible Chronic Kidney Disease.    Anion gap 9 5 - 15  Lipid panel     Status: Abnormal   Collection Time: 02/17/14  3:09 AM  Result Value Ref Range   Cholesterol 117 0 - 200 mg/dL   Triglycerides 168 (H) <150 mg/dL   HDL 14 (L) >39 mg/dL   Total CHOL/HDL Ratio 8.4 RATIO   VLDL 34 0 - 40 mg/dL   LDL Cholesterol 69 0 - 99 mg/dL    Comment:        Total Cholesterol/HDL:CHD Risk Coronary Heart Disease Risk Table  Men   Women  1/2 Average Risk   3.4   3.3  Average Risk       5.0   4.4  2 X Average Risk   9.6   7.1  3 X Average Risk  23.4   11.0        Use the calculated Patient Ratio above and the CHD Risk Table to determine the patient's CHD Risk.        ATP III CLASSIFICATION (LDL):  <100     mg/dL   Optimal  100-129  mg/dL   Near or Above                    Optimal  130-159  mg/dL   Borderline  160-189  mg/dL   High  >190     mg/dL   Very High   Glucose, capillary     Status: Abnormal   Collection Time: 02/17/14  8:08 AM  Result Value Ref Range   Glucose-Capillary 132 (H) 70 - 99 mg/dL  Glucose, capillary     Status: Abnormal   Collection Time: 02/17/14 12:15 PM  Result Value Ref Range   Glucose-Capillary 152 (H) 70 - 99 mg/dL   '@BRIEFLABTABLE'$ (sdes,specrequest,cult,reptstatus)   ) Recent Results (from the past 720 hour(s))  Blood culture (routine x 2)     Status: None   Collection Time:  02/19/2014  9:51 AM  Result Value Ref Range Status   Specimen Description BLOOD RIGHT ARM  Final   Special Requests BOTTLES DRAWN AEROBIC AND ANAEROBIC 3CC  Final   Culture   Final    STREPTOCOCCUS GROUP G Note: SUSCEPTIBILITIES PERFORMED ON PREVIOUS CULTURE WITHIN THE LAST 5 DAYS. Note: Gram Stain Report Called to,Read Back By and Verified With: Vedia Coffer RN on 02/15/14 at 06:28 by Rise Mu Performed at [facility]    Report Status 02/17/2014 FINAL  Final  Blood culture (routine x 2)     Status: None   Collection Time: 03/11/2014 10:33 AM  Result Value Ref Range Status   Specimen Description BLOOD LEFT ARM  Final   Special Requests BOTTLES DRAWN AEROBIC AND ANAEROBIC 10CC  Final   Culture   Final    STREPTOCOCCUS GROUP G Note: Gram Stain Report Called to,Read Back By and Verified With: Vedia Coffer RN on 02/15/14 at 06:28 by Rise Mu Performed at [facility]    Report Status 02/17/2014 FINAL  Final   Organism ID, Bacteria STREPTOCOCCUS GROUP G  Final      Susceptibility   Streptococcus group g - MIC (ETEST)*    PENICILLIN 0.016 SENSITIVE Sensitive     * STREPTOCOCCUS GROUP G  Urine culture     Status: None   Collection Time: 03/11/2014 11:09 AM  Result Value Ref Range Status   Specimen Description URINE, CATHETERIZED  Final   Special Requests NONE  Final   Colony Count   Final    >=100,000 COLONIES/ML Performed at [facility]    Culture   Final    PROTEUS MIRABILIS Performed at [facility]    Report Status 02/16/2014 FINAL  Final   Organism ID, Bacteria PROTEUS MIRABILIS  Final      Susceptibility   Proteus mirabilis - MIC*    AMPICILLIN <=2 SENSITIVE Sensitive     CEFAZOLIN <=4 SENSITIVE Sensitive     CEFTRIAXONE <=1 SENSITIVE Sensitive     CIPROFLOXACIN <=0.25 SENSITIVE Sensitive     GENTAMICIN <=1 SENSITIVE Sensitive  LEVOFLOXACIN <=0.12 SENSITIVE Sensitive     NITROFURANTOIN 128 RESISTANT Resistant     TOBRAMYCIN <=1  SENSITIVE Sensitive     TRIMETH/SULFA <=20 SENSITIVE Sensitive     PIP/TAZO <=4 SENSITIVE Sensitive     * PROTEUS MIRABILIS  MRSA PCR Screening     Status: None   Collection Time: 03/15/2014  4:21 PM  Result Value Ref Range Status   MRSA by PCR NEGATIVE NEGATIVE Final    Comment:        The GeneXpert MRSA Assay (FDA approved for NASAL specimens only), is one component of a comprehensive MRSA colonization surveillance program. It is not intended to diagnose MRSA infection nor to guide or monitor treatment for MRSA infections.   Culture, blood (routine x 2)     Status: None (Preliminary result)   Collection Time: 02/15/14 12:10 PM  Result Value Ref Range Status   Specimen Description LEFT ANTECUBITAL  Final   Special Requests BOTTLES DRAWN AEROBIC ONLY 5CC  Final   Culture   Final           BLOOD CULTURE RECEIVED NO GROWTH TO DATE CULTURE WILL BE HELD FOR 5 DAYS BEFORE ISSUING A FINAL NEGATIVE REPORT Performed at [facility]    Report Status PENDING  Incomplete  Culture, blood (routine x 2)     Status: None (Preliminary result)   Collection Time: 02/15/14 12:15 PM  Result Value Ref Range Status   Specimen Description BLOOD LEFT HAND  Final   Special Requests BOTTLES DRAWN AEROBIC ONLY 5CC  Final   Culture   Final           BLOOD CULTURE RECEIVED NO GROWTH TO DATE CULTURE WILL BE HELD FOR 5 DAYS BEFORE ISSUING A FINAL NEGATIVE REPORT Performed at [facility]    Report Status PENDING  Incomplete     Impression/Recommendation  Principal Problem:   Sepsis Active Problems:   Atrial fibrillation with tachycardic ventricular rate   Type 2 diabetes, uncontrolled, with neuropathy   Morbid obesity   Cellulitis and abscess of leg   Cor pulmonale, chronic   Pulmonary hypertension   Diastolic CHF with preserved left ventricular function, NYHA class 2   Obstructive chronic bronchitis without exacerbation   Smoker   NSTEMI (non-ST elevated myocardial  infarction)   Palliative care encounter   Anxiety state   General weakness   Diabetic foot ulcer   SHANEL PRAZAK is a 69 y.o. female with  Group G streptococcal bacteremia with septic shock, NSTEMI, with source of infection being likely cellulitis + possibly infected DFU, possible underlying osteomyelitis  #1 group G streptococcal bacteremia septic shock in the setting of cellulitis and diabetic foot ulcer:  Narrowed to ceftriaxone 2 g daily  Repeat blood cultures  Do not place PICC line until we have proven clearance of blood cultures  Check plain films complete films of left foot given diabetic foot ulceration.  Check ABI and toe brachial indices of feet  Diabetic education smoking cessation wound care management, nutrition, case management all engaged via diabetic foot focused order set  If plain films do NOT show osteo I will get CT of the LE without contrast given renal insufficiency and given her size would likely prohibit MRI without contrast  I would treat her minimum of 4 weeks in case she has endocarditis due to Group G strep,  certainly longer if she has osteomyelitis underlying DFU    #2 Screening: check HIV and viral hepatides  02/17/2014,  12:42 PM   Thank you so much for this interesting consult  [facility] [phone] (pager) [phone] (office) 02/17/2014, 12:42 PM  Rhina Brack[place] 02/17/2014, 12:42 PM

## 2014-02-17 NOTE — Progress Notes (Addendum)
PT Cancellation Note  Patient Details Name: Joy Benitez MRN: [id number] DOB: 07/07/1945   Cancelled Treatment:    Reason Eval/Treat Not Completed: Patient at procedure or test/unavailable. At radiology per nsg.   Fabio Asa 02/17/2014, 3:10 PM Charlotte Crumb, PT DPT  [phone]

## 2014-02-17 NOTE — Progress Notes (Signed)
ANTICOAGULATION CONSULT NOTE - Follow Up Consult  Pharmacy Consult for heparin Indication: atrial fibrillation  Allergies  Allergen Reactions  . Levaquin [Levofloxacin In D5w] Itching    Patient Measurements: Height: 5\' 5"  (165.1 cm) Weight: (!) 367 lb (166.47 kg) IBW/kg (Calculated) : 57 Heparin Dosing Weight: 96kg  Vital Signs: Temp: 97.4 F (36.3 C) (01/05 0332) Temp Source: Oral (01/05 0332) BP: 99/50 mmHg (01/05 0400) Pulse Rate: 81 (01/05 0600)  Labs:  Recent Labs  02/24/2014 0951 03/05/2014 1625  02/22/2014 2149 02/15/14 0300  02/16/14 0349 02/16/14 1334 02/17/14 0309  HGB 12.3  --   --   --  10.7*  --  10.3*  --  10.9*  HCT 39.2  --   --   --  34.3*  --  33.9*  --  35.5*  PLT 361  --   --   --  284  --  294  --  311  APTT  --   --   --   --  100*  --   --   --   --   LABPROT  --   --   --   --  20.1*  --   --   --   --   INR  --   --   --   --  1.70*  --   --   --   --   HEPARINUNFRC  --   --   < >  --  0.35  < > 0.24* 0.51 0.42  CREATININE 1.80*  --   --   --  1.78*  --   --   --  1.87*  CKTOTAL  --  355*  --   --   --   --   --   --   --   TROPONINI  --  7.63*  --  7.34* 7.01*  --   --   --   --   < > = values in this interval not displayed.  Estimated Creatinine Clearance: 47.1 mL/min (by C-G formula based on Cr of 1.87).   Medications:  Heparin @ 2300 units/hr  Assessment: 68 YOF brought in today with rapid AFib. Started on heparin. H/H remains low but stable. Platelets are within normal limits. No signs or symptoms of bleeding per RN.  Heparin level is therapeutic for 2 levels on 2300 units/hr.   Goal of Therapy:  Heparin level 0.3-0.7 units/ml Monitor platelets by anticoagulation protocol: Yes   Plan: Continue heparin infusion at 2300 units/hr  Monitor daily HL, CBC and s/s of bleeding.   Link Snuffer, PharmD, BCPS Clinical Pharmacist [phone] 02/17/2014, 8:00 AM

## 2014-02-17 NOTE — Progress Notes (Signed)
Subjective: Admitted 1/2 c Cellulitis/Sepsis/Demand MI/Afib RVR/Hypotension/fever/DeH/AFTT and Leukocytosis. Trop I max @ 7 and remains on Hep gtt. BPs some better and off IVF Blood Cxs = Group G Strept and U Cx proteus.  Continuing current Abx.  Bariatric bed Foley in place Skin fold Fungal infections noted On FIO2 IVF turned off   Objective: Vital signs in last 24 hours: Temp:  [97.3 F (36.3 C)-97.5 F (36.4 C)] 97.4 F (36.3 C) (01/05 0332) Pulse Rate:  [72-188] 81 (01/05 0600) Resp:  [14-24] 20 (01/05 0600) BP: (87-106)/(50-77) 99/50 mmHg (01/05 0400) SpO2:  [86 %-97 %] 89 % (01/05 0600) Weight change:  Last BM Date: 02/15/14  CBG (last 3)   Recent Labs  02/16/14 1303 02/16/14 1727 02/16/14 2138  GLUCAP 124* 122* 114*    Intake/Output from previous day:  Intake/Output Summary (Last 24 hours) at 02/17/14 0719 Last data filed at 02/17/14 0600  Gross per 24 hour  Intake   2169 ml  Output      0 ml  Net   2169 ml   01/04 0701 - 01/05 0700 In: 2169 [P.O.:240; I.V.:1129; IV Piggyback:800] Out: -    Physical Exam  General appearance: Sick appearing. Ill OP - Dry.  Poor Dentition Neck: no adenopathy, no carotid bruit,  Hard to assess JVD Resp: Distant, Diminished all over Cardio: Irreg/rate controlled GI: Obese, non tender. Extremities: 2-3 + Edema, Venosus stasis, cellulitis improving, Excoriations, tender. BPulses: 1- 2+ and symmetric Lymph nodes: no cervical lymphadenopathy Neurologic: more awake and more alert.  Interactive  Lab Results:  Recent Labs  02/15/14 0300 02/17/14 0309  NA 133* 133*  K 4.2 4.3  CL 104 103  CO2 22 21  GLUCOSE 195* 132*  BUN 43* 49*  CREATININE 1.78* 1.87*  CALCIUM 8.3* 8.3*     Recent Labs  02/15/14 0300 02/17/14 0309  AST 51* 39*  ALT 13 17  ALKPHOS 82 98  BILITOT 1.6* 1.7*  PROT 7.1 7.4  ALBUMIN 2.6* 2.6*     Recent Labs  02-27-2014 0951  02/16/14 0349 02/17/14 0309  WBC 26.6*  < > 13.8*  12.8*  NEUTROABS 22.6*  --   --   --   HGB 12.3  < > 10.3* 10.9*  HCT 39.2  < > 33.9* 35.5*  MCV 85.6  < > 84.8 84.9  PLT 361  < > 294 311  < > = values in this interval not displayed.  Lab Results  Component Value Date   INR 1.70* 02/15/2014   INR 1.74* 04/22/2010   INR 1.59* 04/21/2010     Recent Labs  02-27-14 1625 02-27-14 2149 02/15/14 0300  CKTOTAL 355*  --   --   TROPONINI 7.63* 7.34* 7.01*    No results for input(s): TSH, T4TOTAL, T3FREE, THYROIDAB in the last 72 hours.  Invalid input(s): FREET3  No results for input(s): VITAMINB12, FOLATE, FERRITIN, TIBC, IRON, RETICCTPCT in the last 72 hours.  Micro Results: Recent Results (from the past 240 hour(s))  Blood culture (routine x 2)     Status: None (Preliminary result)   Collection Time: 02/27/14  9:51 AM  Result Value Ref Range Status   Specimen Description BLOOD RIGHT ARM  Final   Special Requests BOTTLES DRAWN AEROBIC AND ANAEROBIC 3CC  Final   Culture   Final    STREPTOCOCCUS GROUP G Note: Gram Stain Report Called to,Read Back By and Verified With: Ara Kussmaul RN on 02/15/14 at 06:28 by Christie Nottingham Performed at [facility]    Report Status PENDING  Incomplete  Blood culture (routine x 2)     Status: None (Preliminary result)   Collection Time: 03/07/2014 10:33 AM  Result Value Ref Range Status   Specimen Description BLOOD LEFT ARM  Final   Special Requests BOTTLES DRAWN AEROBIC AND ANAEROBIC 10CC  Final   Culture   Final    STREPTOCOCCUS GROUP G Note: Gram Stain Report Called to,Read Back By and Verified With: Ara Kussmaul RN on 02/15/14 at 06:28 by Christie Nottingham Performed at [facility]    Report Status PENDING  Incomplete  Urine culture     Status: None   Collection Time: March 07, 2014 11:09 AM  Result Value Ref Range Status   Specimen Description URINE, CATHETERIZED  Final   Special Requests NONE  Final   Colony Count   Final    >=100,000 COLONIES/ML Performed at [facility]    Culture   Final    PROTEUS MIRABILIS Performed at [facility]    Report Status 02/16/2014 FINAL  Final   Organism ID, Bacteria PROTEUS MIRABILIS  Final      Susceptibility   Proteus mirabilis - MIC*    AMPICILLIN <=2 SENSITIVE Sensitive     CEFAZOLIN <=4 SENSITIVE Sensitive     CEFTRIAXONE <=1 SENSITIVE Sensitive     CIPROFLOXACIN <=0.25 SENSITIVE Sensitive     GENTAMICIN <=1 SENSITIVE Sensitive     LEVOFLOXACIN <=0.12 SENSITIVE Sensitive     NITROFURANTOIN 128 RESISTANT Resistant     TOBRAMYCIN <=1 SENSITIVE Sensitive     TRIMETH/SULFA <=20 SENSITIVE Sensitive     PIP/TAZO <=4 SENSITIVE Sensitive     * PROTEUS MIRABILIS  MRSA PCR Screening     Status: None   Collection Time: March 07, 2014  4:21 PM  Result Value Ref Range Status   MRSA by PCR NEGATIVE NEGATIVE Final    Comment:        The GeneXpert MRSA Assay (FDA approved for NASAL specimens only), is one component of a comprehensive MRSA colonization surveillance program. It is not intended to diagnose MRSA infection nor to guide or monitor treatment for MRSA infections.   Culture, blood (routine x 2)     Status: None (Preliminary result)   Collection Time: 02/15/14 12:10 PM  Result Value Ref Range Status   Specimen Description LEFT ANTECUBITAL  Final   Special Requests BOTTLES DRAWN AEROBIC ONLY 5CC  Final   Culture   Final           BLOOD CULTURE RECEIVED NO GROWTH TO DATE CULTURE WILL BE HELD FOR 5 DAYS BEFORE ISSUING A FINAL NEGATIVE REPORT Performed at [facility]    Report Status PENDING  Incomplete  Culture, blood (routine x 2)     Status: None (Preliminary result)   Collection Time: 02/15/14 12:15 PM  Result Value Ref Range Status   Specimen Description BLOOD LEFT HAND  Final   Special Requests BOTTLES DRAWN AEROBIC ONLY 5CC  Final   Culture   Final           BLOOD CULTURE RECEIVED NO GROWTH TO DATE CULTURE WILL BE HELD FOR 5 DAYS BEFORE ISSUING A FINAL NEGATIVE REPORT Performed  at [facility]    Report Status PENDING  Incomplete     Studies/Results: No results found.   Medications: Scheduled: . antiseptic oral rinse  7 mL Mouth Rinse BID  . aspirin  81 mg Oral Daily  . atorvastatin  80  mg Oral q1800  . digoxin  0.0625 mg Oral Daily  . gabapentin  600 mg Oral BID  . hydrocerin  1 application Topical BID  . insulin aspart  0-15 Units Subcutaneous TID WC  . insulin aspart  0-5 Units Subcutaneous QHS  . insulin glargine  15 Units Subcutaneous QHS  . metoprolol tartrate  12.5 mg Oral BID  . nystatin cream   Topical BID  . piperacillin-tazobactam (ZOSYN)  IV  3.375 g Intravenous Q8H  . senna  1 tablet Oral BID  . vancomycin  1,250 mg Intravenous Q12H   Continuous: . heparin 2,300 Units/hr (02/16/14 0800)     Assessment/Plan: Principal Problem:   Sepsis Active Problems:   Atrial fibrillation with tachycardic ventricular rate   Type 2 diabetes, uncontrolled, with neuropathy   Morbid obesity   Cellulitis and abscess of leg   Cor pulmonale, chronic   Pulmonary hypertension   Diastolic CHF with preserved left ventricular function, NYHA class 2   Obstructive chronic bronchitis without exacerbation   Smoker   NSTEMI (non-ST elevated myocardial infarction)   Palliative care encounter   Anxiety state   General weakness  SEPSIS = Cellulitis/Sepsis/AMS/Demand MI/Afib RVR/Hypotension/fever/DeH/AFTT/Very Elevated Lactaic Acid and Leukocytosis. Gram + cocci in chains Bacteremia = group G Strept - get TTE to R/out Endocarditis.  May need TEE? Or just 4 weeks Abx +.  Will consider ID consult. Continue Vanco/Zosyn. Keep SBP @ 90+. No Pressors needed. Proteus UTI Noted S to Zosyn Recheck Blood Cx sent and will repeat whenever she has fevers.  She has not had repeat Fevers BPs better and able to be off IVF resuscitation at this time.  AKI on top of CKD - Cr up to 1.87.  Follow.  Leukocytosis up to 26.6 in ED- down to 15.8 - 13.8 - 12.8 currently  c Broad Spectrum Abx.    NSTEMI/Acute Demand MI c peak Trop I 7.63 and elevated PRO-BNP secondary to Sepsis and presumed underlying CAD. Conservative management. May need CATH after Acute issues resolve but c Cr 1.8 we would need to be careful. S/P lots of fluid for Sepsis which made her Vol Overload worse. last BNP is 829.  Sats stable on supplemental O2.  Continue hep gtt.  LDL only 69  AFIb RVR - Cardizem IV one - PO cardizem if HR and BP tolerate.  HR currently fine. Continue low dose Dig. Dig level 0.5. She has refused Coumadin several in the past as she was on it for a little while and come off on her own. She has also refused the newer agents. Cards on board.  BB when tolerates.  CHF - Current EF dropped to 25-30%. LV Cavity size markedly dilated.  Mod LVH.  AK of Anteroseptal Myocardium.  PAP 51 mm Hg.  ECHO worse post MI  Last ECHO 11/05/13 c/w HFpEF - EF 50-55%-Abnormal septal motion. The cavity size was normal. Wall thickness was increased in a pattern of severe LVH. RA and RV Mod Dilated; PA peak pressure: 43 mm Hg.  Diastolic/R Sided c recurring Anasarca although recent Ab [place] in March 2015 showed Hepatomegaly with suspected hepatic steatosis/Prior cholecystectomy/No ascites. Restart Lasix/Aldactonewhen BP toplerates  Severe Pulmonary HTN/Cor Pulmonale from Obesity Hypoventilation and COPD. She has not wanted formal sleep study and CPA/BiPAP use  Morbid obesity - Goal Wt is < 300. She fluctuates up and down 50#s depending on Edema.  Wt now 71 - she has gained lots of weight!  Due to chronic illness she also  has Prot Cal Malnutrition - Low Albumin - needs wt loss but to be able to maintain muscle mass. Consult nutrition when some better  COPD/Smoker - Needs to stay quit forever - Provide inhalers/Nebs prn Hypertension - Now Hypotensive due to sepsis. Try to restore BP Anemia - Hbg at 12.3 - 10.7 - 10.3 -  10.9 post fluids. Will follow Chronic Low back pain/Degenerative disk  disease/Gait Instability/Fall Risk/Chronic OA - Walker. Pain meds (they unfortunately lower BP).  High Fall risk.  Will need PT when some better DM2 Neuropathy. - Gabapentin Chronic Venous insufficiency with chronic venous stasis c lower extremity edema.   Fungal Dermatitis - Nystatin  Type II or unspecified type diabetes mellitus with neurological manifestations, uncontrolled- insulin as able- Lantus/Novolog- Hold Metformin c lactic acidosis -   Recent Labs  02/16/14 1303 02/16/14 1727 02/16/14 06-28-2136  GLUCAP 124* 122* 114*   ID -  Anti-infectives    Start     Dose/Rate Route Frequency Ordered Stop   02/15/14 1200  vancomycin (VANCOCIN) 2,500 mg in sodium chloride 0.9 % 500 mL IVPB  Status:  Discontinued     2,500 mg250 mL/hr over 120 Minutes Intravenous Every 24 hours February 27, 2014 1555 27-Feb-2014 1608   02/15/14 0200  vancomycin (VANCOCIN) 1,250 mg in sodium chloride 0.9 % 250 mL IVPB     1,250 mg166.7 mL/hr over 90 Minutes Intravenous Every 12 hours 02/27/2014 1412     02/27/2014 2200  piperacillin-tazobactam (ZOSYN) IVPB 3.375 g     3.375 g12.5 mL/hr over 240 Minutes Intravenous Every 8 hours 2014-02-27 1412     02-27-2014 1800  piperacillin-tazobactam (ZOSYN) IVPB 3.375 g  Status:  Discontinued     3.375 g100 mL/hr over 30 Minutes Intravenous 4 times per day 2014/02/27 1555 02-27-2014 1608   02/27/14 1145  piperacillin-tazobactam (ZOSYN) IVPB 3.375 g     3.375 g100 mL/hr over 30 Minutes Intravenous  Once 2014-02-27 1130 2014-02-27 1238   Feb 27, 2014 1145  vancomycin (VANCOCIN) 2,500 mg in sodium chloride 0.9 % 500 mL IVPB     2,500 mg250 mL/hr over 120 Minutes Intravenous  Once 02-27-14 1130 02/27/2014 1405      DNR.  Palliative care consult appreciated and they recommended: 1. Anxiety: Continue Xanax 0.5 mg BID prn. May need something scheduled qhs to help with sleep.  2. Sleep disturbance: Likely related to anxiety.  3. Depression since death of daughter: 06/29/22 consider antidepressant if needed.   4. Generalized Weakness: She was walking independently in her home with a walker PTA. Consider PT. Continue medical management.She does have a poor prognosis.  Continue Stepdown, IV Abx, Lasix and aldactone held, Cardizem prn, start low dose BB c parameters, and continue diet. Currently off IVF and doing better. Continue Dig Low dose. Start PT eval and Rx.  Start Trazodone.    LOS: 3 days   Adreanna Fickel M 02/17/2014, 7:19 AM

## 2014-02-17 NOTE — Consult Note (Signed)
WOC wound consult note Reason for Consult: Diabetic ulcer to left dorsal foot.  Duration:  2 years Wound type: Neuropathic ulcer Measurement: 1 cm x 1.5 cm unable to visualize wound bed due to the presence of dry, devitalized tissue.  Wound bed: 100% dry, brown eschar. Drainage (amount, consistency, odor) Minimal, serosanguinous Periwound: Intact Dressing procedure/placement/frequency: Cleanse ulcer to left dorsal foot with NS and pat gently dry.  Apply santyl to wound bed, 1/8 inch thickness (opaque).  Top with NS moist dressing.  Secure with 4x4 gauze and kerlix/tape.  Change twice daily. Bedside nurse to perform.   Will not follow at this time.  Please re-consult if needed.  Maple Hudson RN BSN CWON Pager [phone]

## 2014-02-17 NOTE — Progress Notes (Signed)
Progress Note from the Palliative Medicine Team at [facility] again with Ms. Kinnaird this morning but she was very sleepy and lethargic. I finally got her to the point where she was awake enough to eat more of her breakfast. She has no complaints this morning. We were not able to talk very much this morning and ID came in to consult with her. She told me yesterday she would like to get back up on her feet to continue walking and she wants to be able to get to know her grandson who is not born yet. I will continue to follow.   Vinie Sill, NP Palliative Medicine Team Pager # [phone] (M-F 8a-5p) Team Phone # [phone] (Nights/Weekends)

## 2014-02-18 ENCOUNTER — Inpatient Hospital Stay (HOSPITAL_COMMUNITY): Payer: Medicare Other

## 2014-02-18 DIAGNOSIS — I2781 Cor pulmonale (chronic): Secondary | ICD-10-CM

## 2014-02-18 DIAGNOSIS — I214 Non-ST elevation (NSTEMI) myocardial infarction: Secondary | ICD-10-CM

## 2014-02-18 LAB — BLOOD GAS, ARTERIAL
Acid-base deficit: 5.8 mmol/L — ABNORMAL HIGH (ref 0.0–2.0)
Bicarbonate: 19.4 mEq/L — ABNORMAL LOW (ref 20.0–24.0)
DRAWN BY: 24610
O2 Content: 4.5 L/min
O2 SAT: 95 %
Patient temperature: 98.6
TCO2: 20.7 mmol/L (ref 0–100)
pCO2 arterial: 40.7 mmHg (ref 35.0–45.0)
pH, Arterial: 7.3 — ABNORMAL LOW (ref 7.350–7.450)
pO2, Arterial: 88.6 mmHg (ref 80.0–100.0)

## 2014-02-18 LAB — HEPATITIS PANEL, ACUTE
HCV Ab: NEGATIVE
HEP A IGM: NONREACTIVE
Hep B C IgM: NONREACTIVE
Hepatitis B Surface Ag: NEGATIVE

## 2014-02-18 LAB — SEDIMENTATION RATE: Sed Rate: 36 mm/hr — ABNORMAL HIGH (ref 0–22)

## 2014-02-18 LAB — COMPREHENSIVE METABOLIC PANEL
ALT: 15 U/L (ref 0–35)
AST: 27 U/L (ref 0–37)
Albumin: 2.6 g/dL — ABNORMAL LOW (ref 3.5–5.2)
Alkaline Phosphatase: 99 U/L (ref 39–117)
Anion gap: 11 (ref 5–15)
BUN: 48 mg/dL — ABNORMAL HIGH (ref 6–23)
CALCIUM: 8.5 mg/dL (ref 8.4–10.5)
CO2: 19 mmol/L (ref 19–32)
CREATININE: 1.86 mg/dL — AB (ref 0.50–1.10)
Chloride: 101 mEq/L (ref 96–112)
GFR calc Af Amer: 31 mL/min — ABNORMAL LOW (ref >90)
GFR calc non Af Amer: 27 mL/min — ABNORMAL LOW (ref >90)
GLUCOSE: 131 mg/dL — AB (ref 70–99)
POTASSIUM: 4.3 mmol/L (ref 3.5–5.1)
Sodium: 131 mmol/L — ABNORMAL LOW (ref 135–145)
Total Bilirubin: 1.7 mg/dL — ABNORMAL HIGH (ref 0.3–1.2)
Total Protein: 7 g/dL (ref 6.0–8.3)

## 2014-02-18 LAB — PREALBUMIN: Prealbumin: 9.1 mg/dL — ABNORMAL LOW (ref 17.0–34.0)

## 2014-02-18 LAB — HEMOGLOBIN A1C
HEMOGLOBIN A1C: 7.4 % — AB (ref <5.7)
MEAN PLASMA GLUCOSE: 166 mg/dL — AB (ref <117)

## 2014-02-18 LAB — CBC
HCT: 35.5 % — ABNORMAL LOW (ref 36.0–46.0)
Hemoglobin: 11 g/dL — ABNORMAL LOW (ref 12.0–15.0)
MCH: 26.8 pg (ref 26.0–34.0)
MCHC: 31 g/dL (ref 30.0–36.0)
MCV: 86.6 fL (ref 78.0–100.0)
PLATELETS: 312 10*3/uL (ref 150–400)
RBC: 4.1 MIL/uL (ref 3.87–5.11)
RDW: 19.2 % — AB (ref 11.5–15.5)
WBC: 11.9 10*3/uL — ABNORMAL HIGH (ref 4.0–10.5)

## 2014-02-18 LAB — GLUCOSE, CAPILLARY
GLUCOSE-CAPILLARY: 125 mg/dL — AB (ref 70–99)
GLUCOSE-CAPILLARY: 129 mg/dL — AB (ref 70–99)
GLUCOSE-CAPILLARY: 143 mg/dL — AB (ref 70–99)
Glucose-Capillary: 115 mg/dL — ABNORMAL HIGH (ref 70–99)

## 2014-02-18 LAB — AMMONIA: Ammonia: 61 umol/L — ABNORMAL HIGH (ref 11–32)

## 2014-02-18 LAB — HEPARIN LEVEL (UNFRACTIONATED): Heparin Unfractionated: 0.59 IU/mL (ref 0.30–0.70)

## 2014-02-18 LAB — LACTIC ACID, PLASMA: LACTIC ACID, VENOUS: 1.2 mmol/L (ref 0.5–2.2)

## 2014-02-18 LAB — C-REACTIVE PROTEIN: CRP: 5.1 mg/dL — AB (ref <0.60)

## 2014-02-18 MED ORDER — WHITE PETROLATUM GEL
Status: AC
  Administered 2014-02-18: 1
  Filled 2014-02-18: qty 1

## 2014-02-18 MED ORDER — FUROSEMIDE 20 MG PO TABS
10.0000 mg | ORAL_TABLET | Freq: Every day | ORAL | Status: DC
  Administered 2014-02-18: 10 mg via ORAL
  Filled 2014-02-18: qty 0.5

## 2014-02-18 MED ORDER — SODIUM CHLORIDE 0.9 % IJ SOLN
10.0000 mL | Freq: Two times a day (BID) | INTRAMUSCULAR | Status: DC
  Administered 2014-02-18 – 2014-02-19 (×3): 10 mL
  Administered 2014-02-20: 20 mL
  Administered 2014-02-20 – 2014-02-22 (×5): 10 mL

## 2014-02-18 MED ORDER — GLUCERNA SHAKE PO LIQD
237.0000 mL | Freq: Three times a day (TID) | ORAL | Status: DC
Start: 2014-02-18 — End: 2014-02-21
  Administered 2014-02-18 (×2): 237 mL via ORAL

## 2014-02-18 MED ORDER — ENOXAPARIN SODIUM 30 MG/0.3ML ~~LOC~~ SOLN
30.0000 mg | SUBCUTANEOUS | Status: DC
  Administered 2014-02-18 – 2014-02-21 (×4): 30 mg via SUBCUTANEOUS
  Filled 2014-02-18 (×6): qty 0.3

## 2014-02-18 MED ORDER — SODIUM CHLORIDE 0.9 % IJ SOLN
10.0000 mL | INTRAMUSCULAR | Status: DC | PRN
  Administered 2014-02-19: 10 mL
  Filled 2014-02-18: qty 40

## 2014-02-18 MED ORDER — FUROSEMIDE 40 MG PO TABS
40.0000 mg | ORAL_TABLET | Freq: Every day | ORAL | Status: DC
  Filled 2014-02-18: qty 1

## 2014-02-18 MED ORDER — ADULT MULTIVITAMIN W/MINERALS CH
1.0000 | ORAL_TABLET | Freq: Every day | ORAL | Status: DC
  Administered 2014-02-18: 1 via ORAL
  Filled 2014-02-18 (×4): qty 1

## 2014-02-18 NOTE — Evaluation (Signed)
Physical Therapy Evaluation Patient Details Name: Joy Benitez MRN: [id number] DOB: 1945/08/18 Today's Date: 02/18/2014   History of Present Illness  69 y.o. female with past mental history significant for atrial fibrillation, Morbid obesity, COPD, smokering chronic congestive heart failure, poorly controlled diabetes mellitus with peripheral neuropathy and diabetic foot ulcer, recurrent lower extremity cellulitis who is admitted with septic shock due to progressive cellulitis involving her left lower extremity.  Clinical Impression  Patient demonstrates deficits in functional mobility as indicated below. Will need continued skilled PT to address deficits and maximize function. Will see as indicated and progress as tolerated.  At this time, evaluation limited to bed mobility, therapeutic activities for BLE, and cognition. Patient very lethargic but able to follow simple commands with cues. Patient with poor functional tolerance at this time. Activity performed with 3 person assist.  VSS, on 5 liters O2 during activity 3 liters at rest.  Of note, prior to admission patient was ambulatory.    Follow Up Recommendations SNF    Equipment Recommendations       Recommendations for Other Services       Precautions / Restrictions        Mobility  Bed Mobility Overal bed mobility: Needs Assistance;+2 for physical assistance;+ 2 for safety/equipment Bed Mobility: Rolling;Supine to Sit Rolling: Max assist;+2 for physical assistance;+2 for safety/equipment   Supine to sit: HOB elevated;+2 for physical assistance;+2 for safety/equipment (modified use of HOB to assist for modified chair position)     General bed mobility comments: patient initiates active movement but unable to have functional impact to self assist with mobility, +3 assist for bed mobility  Transfers                    Ambulation/Gait                Stairs            Wheelchair Mobility     Modified Rankin (Stroke Patients Only)       Balance                                             Pertinent Vitals/Pain Pain Assessment: Faces Faces Pain Scale: Hurts even more Pain Location: undetermined Pain Descriptors / Indicators: Grimacing Pain Intervention(s): Limited activity within patient's tolerance;Monitored during session;Repositioned    Home Living Family/patient expects to be discharged to:: Skilled nursing facility   Available Help at Discharge: Family;Available 24 hours/day Type of Home: House Home Access: Stairs to enter Entrance Stairs-Rails: Right Entrance Stairs-Number of Steps: 2 Home Layout: Two level;Able to live on main level with bedroom/bathroom (Wash room down 3 steps) Home Equipment: Grab bars - tub/shower;Walker - 2 wheels (Walking stick)      Prior Function Level of Independence: Needs assistance   Gait / Transfers Assistance Needed: uses walker inside and outside     Comments: uses walker inside and outside     Hand Dominance   Dominant Hand: Right    Extremity/Trunk Assessment               Lower Extremity Assessment: Generalized weakness;RLE deficits/detail;LLE deficits/detail;Difficult to assess due to impaired cognition RLE Deficits / Details: limited ROM secondary to body habitus, weakness noted LLE Deficits / Details: limited ROM secondary to body habitus, weakness noted  Cervical / Trunk Assessment:  (increased body habitus)  Communication  Communication: No difficulties  Cognition Arousal/Alertness: Lethargic Behavior During Therapy: Flat affect Overall Cognitive Status: Difficult to assess                      General Comments      Exercises        Assessment/Plan    PT Assessment Patient needs continued PT services  PT Diagnosis Difficulty walking;Generalized weakness;Altered mental status   PT Problem List Decreased strength;Decreased range of motion;Decreased activity  tolerance;Decreased balance;Decreased mobility;Decreased coordination;Decreased cognition;Cardiopulmonary status limiting activity;Obesity;Decreased skin integrity;Pain  PT Treatment Interventions DME instruction;Gait training;Functional mobility training;Therapeutic activities;Therapeutic exercise;Balance training;Cognitive remediation;Patient/family education   PT Goals (Current goals can be found in the Care Plan section) Acute Rehab PT Goals PT Goal Formulation: Patient unable to participate in goal setting Time For Goal Achievement: 03/04/14 Potential to Achieve Goals: Fair    Frequency Min 2X/week   Barriers to discharge        Co-evaluation               End of Session Equipment Utilized During Treatment: Oxygen Activity Tolerance: Patient limited by fatigue;Patient limited by lethargy Patient left: in bed;with call bell/phone within reach Nurse Communication: Mobility status;Need for lift equipment (recommend use of bari mechanical chair)         Time: 1236-1310 PT Time Calculation (min) (ACUTE ONLY): 34 min   Charges:   PT Evaluation $Initial PT Evaluation Tier I: 1 Procedure PT Treatments $Therapeutic Activity: 23-37 mins   PT G CodesFabio Asa March 05, 2014, 2:10 PM Charlotte Crumb, PT DPT  [phone]

## 2014-02-18 NOTE — Progress Notes (Addendum)
Subjective: Admitted 1/2 c Cellulitis/Sepsis/Demand MI/Afib RVR/Hypotension/fever/DeH/AFTT and Leukocytosis. Trop I max @ 7 and remains on Hep gtt. BPs better this am Blood Cxs = Group G Strept and U Cx proteus.  Continuing current Abx.  Appreciate ID input On FIO2  This am she is confused and off She is wearing mittens as she is picking and pulling at devices etc. She had a few Desats and a few bradycardia episodes. She has terrible IV Access.   Objective: Vital signs in last 24 hours: Temp:  [97 F (36.1 C)-98.2 F (36.8 C)] 97.5 F (36.4 C) (01/06 0400) Pulse Rate:  [58-96] 96 (01/06 0625) Resp:  [14-25] 17 (01/06 0625) BP: (92-115)/(49-90) 108/75 mmHg (01/06 0625) SpO2:  [82 %-100 %] 98 % (01/06 0625) Weight:  [178.264 kg (393 lb)] 178.264 kg (393 lb) (01/06 0630) Weight change:  Last BM Date: 02/15/14  CBG (last 3)   Recent Labs  02/17/14 1215 02/17/14 1658 02/17/14 2024  GLUCAP 152* 122* 138*    Intake/Output from previous day:  Intake/Output Summary (Last 24 hours) at 02/18/14 0756 Last data filed at 02/18/14 0700  Gross per 24 hour  Intake    299 ml  Output      0 ml  Net    299 ml   01/05 0701 - 01/06 0700 In: 299 [I.V.:299] Out: -    Physical Exam  General appearance: Sick appearing. Ill.  Confused.  Not oriented.  Wearing mittens OP - Dry.  Poor Dentition Neck: no adenopathy, no carotid bruit,  Hard to assess JVD Resp: Distant, Diminished all over Cardio: Irreg/rate controlled GI: Obese, non tender. Extremities: 2-3 + Edema, Venosus stasis, cellulitis improving, Excoriations, tender. BPulses: 1- 2+ and symmetric Lymph nodes: no cervical lymphadenopathy Neurologic: more awake and more alert.  Interactive  Lab Results:  Recent Labs  02/17/14 0309 02/18/14 0229  NA 133* 131*  K 4.3 4.3  CL 103 101  CO2 21 19  GLUCOSE 132* 131*  BUN 49* 48*  CREATININE 1.87* 1.86*  CALCIUM 8.3* 8.5     Recent Labs  02/17/14 0309  02/18/14 0229  AST 39* 27  ALT 17 15  ALKPHOS 98 99  BILITOT 1.7* 1.7*  PROT 7.4 7.0  ALBUMIN 2.6* 2.6*     Recent Labs  02/17/14 0309 02/18/14 0229  WBC 12.8* 11.9*  HGB 10.9* 11.0*  HCT 35.5* 35.5*  MCV 84.9 86.6  PLT 311 312    Lab Results  Component Value Date   INR 1.70* 02/15/2014   INR 1.74* 04/22/2010   INR 1.59* 04/21/2010    No results for input(s): CKTOTAL, CKMB, CKMBINDEX, TROPONINI in the last 72 hours.  No results for input(s): TSH, T4TOTAL, T3FREE, THYROIDAB in the last 72 hours.  Invalid input(s): FREET3  No results for input(s): VITAMINB12, FOLATE, FERRITIN, TIBC, IRON, RETICCTPCT in the last 72 hours.  Micro Results: Recent Results (from the past 240 hour(s))  Blood culture (routine x 2)     Status: None   Collection Time: March 02, 2014  9:51 AM  Result Value Ref Range Status   Specimen Description BLOOD RIGHT ARM  Final   Special Requests BOTTLES DRAWN AEROBIC AND ANAEROBIC 3CC  Final   Culture   Final    STREPTOCOCCUS GROUP G Note: SUSCEPTIBILITIES PERFORMED ON PREVIOUS CULTURE WITHIN THE LAST 5 DAYS. Note: Gram Stain Report Called to,Read Back By and Verified With: Ara Kussmaul RN on 02/15/14 at 06:28 by Christie Nottingham Performed at [facility]  Report Status 02/17/2014 FINAL  Final  Blood culture (routine x 2)     Status: None   Collection Time: 02/19/2014 10:33 AM  Result Value Ref Range Status   Specimen Description BLOOD LEFT ARM  Final   Special Requests BOTTLES DRAWN AEROBIC AND ANAEROBIC 10CC  Final   Culture   Final    STREPTOCOCCUS GROUP G Note: Gram Stain Report Called to,Read Back By and Verified With: Ara Kussmaul RN on 02/15/14 at 06:28 by Christie Nottingham Performed at [facility]    Report Status 02/17/2014 FINAL  Final   Organism ID, Bacteria STREPTOCOCCUS GROUP G  Final      Susceptibility   Streptococcus group g - MIC (ETEST)*    PENICILLIN 0.016 SENSITIVE Sensitive     * STREPTOCOCCUS GROUP G  Urine culture      Status: None   Collection Time: 02/24/2014 11:09 AM  Result Value Ref Range Status   Specimen Description URINE, CATHETERIZED  Final   Special Requests NONE  Final   Colony Count   Final    >=100,000 COLONIES/ML Performed at [facility]    Culture   Final    PROTEUS MIRABILIS Performed at [facility]    Report Status 02/16/2014 FINAL  Final   Organism ID, Bacteria PROTEUS MIRABILIS  Final      Susceptibility   Proteus mirabilis - MIC*    AMPICILLIN <=2 SENSITIVE Sensitive     CEFAZOLIN <=4 SENSITIVE Sensitive     CEFTRIAXONE <=1 SENSITIVE Sensitive     CIPROFLOXACIN <=0.25 SENSITIVE Sensitive     GENTAMICIN <=1 SENSITIVE Sensitive     LEVOFLOXACIN <=0.12 SENSITIVE Sensitive     NITROFURANTOIN 128 RESISTANT Resistant     TOBRAMYCIN <=1 SENSITIVE Sensitive     TRIMETH/SULFA <=20 SENSITIVE Sensitive     PIP/TAZO <=4 SENSITIVE Sensitive     * PROTEUS MIRABILIS  MRSA PCR Screening     Status: None   Collection Time: 02/26/2014  4:21 PM  Result Value Ref Range Status   MRSA by PCR NEGATIVE NEGATIVE Final    Comment:        The GeneXpert MRSA Assay (FDA approved for NASAL specimens only), is one component of a comprehensive MRSA colonization surveillance program. It is not intended to diagnose MRSA infection nor to guide or monitor treatment for MRSA infections.   Culture, blood (routine x 2)     Status: None (Preliminary result)   Collection Time: 02/15/14 12:10 PM  Result Value Ref Range Status   Specimen Description LEFT ANTECUBITAL  Final   Special Requests BOTTLES DRAWN AEROBIC ONLY 5CC  Final   Culture   Final           BLOOD CULTURE RECEIVED NO GROWTH TO DATE CULTURE WILL BE HELD FOR 5 DAYS BEFORE ISSUING A FINAL NEGATIVE REPORT Performed at [facility]    Report Status PENDING  Incomplete  Culture, blood (routine x 2)     Status: None (Preliminary result)   Collection Time: 02/15/14 12:15 PM  Result Value Ref Range Status   Specimen  Description BLOOD LEFT HAND  Final   Special Requests BOTTLES DRAWN AEROBIC ONLY 5CC  Final   Culture   Final           BLOOD CULTURE RECEIVED NO GROWTH TO DATE CULTURE WILL BE HELD FOR 5 DAYS BEFORE ISSUING A FINAL NEGATIVE REPORT Performed at [facility]    Report Status PENDING  Incomplete  Studies/Results: Dg Foot Complete Left  02/17/2014   CLINICAL DATA:  Diabetic foot ulcer.  Left foot pain.  EXAM: LEFT FOOT - COMPLETE 3+ VIEW  COMPARISON:  None.  FINDINGS: There is no evidence of fracture or dislocation. Mild spurring of posterior calcaneus is noted. No definite lytic destruction is seen to suggest osteomyelitis no radiopaque foreign body is noted. Soft tissues are unremarkable.  IMPRESSION: No acute abnormality seen in the left foot.   Electronically Signed   By: Roque Lias M.D.   On: 02/17/2014 16:09     Medications: Scheduled: . antiseptic oral rinse  7 mL Mouth Rinse BID  . aspirin  81 mg Oral Daily  . atorvastatin  80 mg Oral q1800  . cefTRIAXone (ROCEPHIN)  IV  2 g Intravenous Q24H  . collagenase   Topical Daily  . digoxin  0.0625 mg Oral Daily  . gabapentin  600 mg Oral BID  . hydrocerin  1 application Topical BID  . insulin aspart  0-15 Units Subcutaneous TID WC  . insulin aspart  0-5 Units Subcutaneous QHS  . insulin glargine  15 Units Subcutaneous QHS  . metoprolol succinate  25 mg Oral Daily  . nystatin cream   Topical BID  . senna  1 tablet Oral BID  . traZODone  50 mg Oral QHS   Continuous: . heparin 2,300 Units/hr (02/18/14 1610)     Assessment/Plan: Principal Problem:   Sepsis Active Problems:   Atrial fibrillation with tachycardic ventricular rate   Type 2 diabetes, uncontrolled, with neuropathy   Morbid obesity   Cellulitis and abscess of leg   Cor pulmonale, chronic   Pulmonary hypertension   Diastolic CHF with preserved left ventricular function, NYHA class 2   Obstructive chronic bronchitis without exacerbation   Smoker    NSTEMI (non-ST elevated myocardial infarction)   Palliative care encounter   Anxiety state   General weakness   Diabetic foot ulcer   Group G streptococcal infection   Renal failure (ARF), acute on chronic   Severe sepsis with acute organ dysfunction  SEPSIS = Cellulitis/Sepsis/AMS/Demand MI/Afib RVR/Hypotension/fever/DeH/AFTT/Very Elevated [place]act[place] Acid and Leukocytosis. Gram + cocci in chains Bacteremia = group G Strept - TTE was (-) but not conclusive to R/out Endocarditis.  May need TEE? Or just 2-4 weeks Abx +.  Appreciate ID consult. Continue Rocephin Keep SBP @ 90+. No Pressors needed. Proteus UTI Noted S to most Abx Recheck Blood Cx sent and Negative thus far.  Will repeat whenever she has fevers.  She has not had repeat Fevers BPs better andremains off IVF resuscitation at this time.  Foot Xray - No acute abnormality seen in the left foot. CT P  Needs PICC access now as she has terrible access.  She will never be a HD candidate.  Cxs (-) to date.  ID aware of PICC placement.  AKI on top of CKD - Cr up to 1.86.  Follow.  Leukocytosis up to 26.6 in ED- down to 15.8 - 13.8 - 12.8 - 11.9 currently on Abx.    NSTEMI/Acute Demand MI c peak Trop I 7.63 and elevated PRO-BNP secondary to Sepsis and presumed underlying CAD. Conservative management. May need CATH after Acute issues resolve but c Cr 1.8 we would need to be careful. S/P lots of fluid for Sepsis which made her Vol Overload worse. last BNP is 829.  Sats stable on supplemental O2.   LDL only 69 Cont heparin, ASA/Statin; change metoprolol to toprol. EF significantly reduced; no  ACEI due to hypotension and renal insuff. Consider myoview vs cath if she improves.  It has been 5 days and hep gtt is leaking so will D/c Hep gtt and change to lovenox  AFIb RVR - Cardizem IV one - PO cardizem if HR and BP tolerate.  HR currently fine. Continue low dose Dig. Dig level 0.5. She has refused Coumadin several in the past as she was on  it for a little while and come off on her own. She has also refused the newer agents. Cards on board.  BB when tolerates.  CHF - Current EF dropped to 25-30%. LV Cavity size markedly dilated.  Mod LVH.  AK of Anteroseptal Myocardium.  PAP 51 mm Hg.  ECHO worse post MI  Last ECHO 11/05/13 c/w HFpEF - EF 50-55%-Abnormal septal motion. The cavity size was normal. Wall thickness was increased in a pattern of severe LVH. RA and RV Mod Dilated; PA peak pressure: 43 mm Hg.  Diastolic/R Sided c recurring Anasarca although recent Ab [place] in March 2015 showed Hepatomegaly with suspected hepatic steatosis/Prior cholecystectomy/No ascites. Restart Lasix/Aldactonewhen BP toplerates  Severe Pulmonary HTN/Cor Pulmonale from Obesity Hypoventilation and COPD. She has not wanted formal sleep study and CPA/BiPAP use.  Brady events last night may have been OSA related  Morbid obesity - Goal Wt is < 300. She fluctuates up and down 50#s depending on Edema.   Due to chronic illness she also has Prot Cal Malnutrition - Low Albumin - needs wt loss but to be able to maintain muscle mass. Consult nutrition when some better  COPD/Smoker - Needs to stay quit forever - Provide inhalers/Nebs prn Hypertension - Now Hypotensive due to sepsis. Try to restore BP Anemia - Hbg at 12.3 - 10.7 - 10.3 -  10.9  - 11.0 post fluids. Will follow Chronic Low back pain/Degenerative disk disease/Gait Instability/Fall Risk/Chronic OA - Walker. Pain meds (they unfortunately lower BP).  High Fall risk.  Will need PT when some better DM2 Neuropathy. - Gabapentin Chronic Venous insufficiency with chronic venous stasis c lower extremity edema.   Fungal Dermatitis - Nystatin  Type II or unspecified type diabetes mellitus with neurological manifestations, uncontrolled- insulin as able- Lantus/Novolog- Hold Metformin c lactic acidosis -   Recent Labs  02/17/14 1215 02/17/14 1658 02/17/14 2024  GLUCAP 152* 122* 138*   ID -   Anti-infectives    Start     Dose/Rate Route Frequency Ordered Stop   02/17/14 1000  cefTRIAXone (ROCEPHIN) 2 g in dextrose 5 % 50 mL IVPB - Premix     2 g100 mL/hr over 30 Minutes Intravenous Every 24 hours 02/17/14 0907     02/15/14 1200  vancomycin (VANCOCIN) 2,500 mg in sodium chloride 0.9 % 500 mL IVPB  Status:  Discontinued     2,500 mg250 mL/hr over 120 Minutes Intravenous Every 24 hours 02/20/2014 1555 2014-02-20 1608   02/15/14 0200  vancomycin (VANCOCIN) 1,250 mg in sodium chloride 0.9 % 250 mL IVPB  Status:  Discontinued     1,250 mg166.7 mL/hr over 90 Minutes Intravenous Every 12 hours 02/20/14 1412 02/17/14 0907   2014-02-20 2200  piperacillin-tazobactam (ZOSYN) IVPB 3.375 g  Status:  Discontinued     3.375 g12.5 mL/hr over 240 Minutes Intravenous Every 8 hours Feb 20, 2014 1412 02/17/14 0907   2014-02-20 1800  piperacillin-tazobactam (ZOSYN) IVPB 3.375 g  Status:  Discontinued     3.375 g100 mL/hr over 30 Minutes Intravenous 4 times per day Feb 20, 2014 1555 02/20/14 1608  03/04/2014 1145  piperacillin-tazobactam (ZOSYN) IVPB 3.375 g     3.375 g100 mL/hr over 30 Minutes Intravenous  Once 03/03/2014 1130 02/20/2014 1238   03/06/2014 1145  vancomycin (VANCOCIN) 2,500 mg in sodium chloride 0.9 % 500 mL IVPB     2,500 mg250 mL/hr over 120 Minutes Intravenous  Once 03/15/2014 1130 02/15/2014 1405      DNR.  Palliative care consult appreciated and they recommended: 1. Anxiety: Continue Xanax 0.5 mg BID prn. May need something scheduled qhs to help with sleep.  2. Sleep disturbance: Likely related to anxiety.  3. Depression since death of daughter: 07-15-2022 consider antidepressant if needed.  4. Generalized Weakness: She was walking independently in her home with a walker PTA. Consider PT. Continue medical management.She does have a poor prognosis.  Continue Stepdown, IV Abx, Lasix and aldactone held, Cardizem prn, start low dose BB c parameters, and continue diet. Currently off IVF and BP better. Continue Dig  Low dose. Start PT eval and Rx.  She is confused post starting trazodone and Xanax standing - will back off.  Check ABG and Ammonia/Lacic Acid.  May be able to start fluids soon.  Needs PICC.  I stopped the Hep gtt as ishe had been on it for 5 days for the MI.  However she could still use it for the Afib.  Will start low dose Lovenox for now and consider coumadin/NOAD.  Cards thought appreciated.  Start lasix 10 mg PO daily Hold sbp < 105    LOS: 4 days   Chou Busler M 02/18/2014, 7:56 AM

## 2014-02-18 NOTE — Progress Notes (Signed)
[facility]    Subjective:  Patient is very sleepy as afternoon when I visited her  Antibiotics:  Anti-infectives    Start     Dose/Rate Route Frequency Ordered Stop   02/17/14 1000  cefTRIAXone (ROCEPHIN) 2 g in dextrose 5 % 50 mL IVPB - Premix     2 g100 mL/hr over 30 Minutes Intravenous Every 24 hours 02/17/14 0907     02/15/14 1200  vancomycin (VANCOCIN) 2,500 mg in sodium chloride 0.9 % 500 mL IVPB  Status:  Discontinued     2,500 mg250 mL/hr over 120 Minutes Intravenous Every 24 hours 03/13/2014 1555 03-13-14 1608   02/15/14 0200  vancomycin (VANCOCIN) 1,250 mg in sodium chloride 0.9 % 250 mL IVPB  Status:  Discontinued     1,250 mg166.7 mL/hr over 90 Minutes Intravenous Every 12 hours 2014-03-13 1412 02/17/14 0907   13-Mar-2014 2200  piperacillin-tazobactam (ZOSYN) IVPB 3.375 g  Status:  Discontinued     3.375 g12.5 mL/hr over 240 Minutes Intravenous Every 8 hours 13-Mar-2014 1412 02/17/14 0907   03-13-14 1800  piperacillin-tazobactam (ZOSYN) IVPB 3.375 g  Status:  Discontinued     3.375 g100 mL/hr over 30 Minutes Intravenous 4 times per day 03-13-14 1555 2014-03-13 1608   13-Mar-2014 1145  piperacillin-tazobactam (ZOSYN) IVPB 3.375 g     3.375 g100 mL/hr over 30 Minutes Intravenous  Once Mar 13, 2014 1130 2014-03-13 1238   2014-03-13 1145  vancomycin (VANCOCIN) 2,500 mg in sodium chloride 0.9 % 500 mL IVPB     2,500 mg250 mL/hr over 120 Minutes Intravenous  Once 13-Mar-2014 1130 03/13/2014 1405      Medications: Scheduled Meds: . antiseptic oral rinse  7 mL Mouth Rinse BID  . aspirin  81 mg Oral Benitez  . atorvastatin  80 mg Oral q1800  . cefTRIAXone (ROCEPHIN)  IV  2 g Intravenous Q24H  . collagenase   Topical Benitez  . digoxin  0.0625 mg Oral Benitez  . enoxaparin (LOVENOX) injection  30 mg Subcutaneous Q24H  . feeding supplement (GLUCERNA SHAKE)  237 mL Oral TID BM  . [START ON 02/19/2014] furosemide  40 mg Oral Benitez  . gabapentin  600 mg Oral BID  . hydrocerin  1  application Topical BID  . insulin aspart  0-15 Units Subcutaneous TID WC  . insulin aspart  0-5 Units Subcutaneous QHS  . insulin glargine  15 Units Subcutaneous QHS  . metoprolol succinate  25 mg Oral Benitez  . multivitamin with minerals  1 tablet Oral Benitez  . nystatin cream   Topical BID  . senna  1 tablet Oral BID  . sodium chloride  10-40 mL Intracatheter Q12H  . white petrolatum       Continuous Infusions:  PRN Meds:.acetaminophen **OR** acetaminophen, albuterol, ALPRAZolam, HYDROcodone-acetaminophen, morphine injection, sodium chloride    Objective: Weight change:   Intake/Output Summary (Last 24 hours) at 02/18/14 1503 Last data filed at 02/18/14 1400  Gross per 24 hour  Intake    505 ml  Output      0 ml  Net    505 ml   Blood pressure 112/74, pulse 83, temperature 97.4 F (36.3 C), temperature source Oral, resp. rate 16, height 5\' 5"  (1.651 m), weight 393 lb (178.264 kg), SpO2 98 %. Temp:  [97 F (36.1 C)-98.2 F (36.8 C)] 97.4 F (36.3 C) (01/06 0824) Pulse Rate:  [58-96] 83 (01/06 1300) Resp:  [0-20] 16 (01/06 1300) BP: (92-115)/(60-90) 112/74 mmHg (01/06  1950) SpO2:  [82 %-100 %] 98 % (01/06 1300) Weight:  [393 lb (178.264 kg)] 393 lb (178.264 kg) (01/06 0630)  Physical Exam: General: Very sleepy able to arouse her self answer questions but I'm concerned about her risk of aspirating she has a plate of food in front of her. Discussed this with the patient and her son and the nurse HEENT: , EOMI CVS tachy rate irr irr Chest: clear to auscultation bilaterally, no wheezing, rales or rhonchi Abdomen: ,morbidly obese nondistended, normal bowel sounds, Extremities, skin: no change   Neuro: nonfocal  CBC:  CBC Latest Ref Rng 02/18/2014 02/17/2014 02/16/2014  WBC 4.0 - 10.5 K/uL 11.9(H) 12.8(H) 13.8(H)  Hemoglobin 12.0 - 15.0 g/dL 11.0(L) 10.9(L) 10.3(L)  Hematocrit 36.0 - 46.0 % 35.5(L) 35.5(L) 33.9(L)  Platelets 150 - 400 K/uL 312 311 294       BMET  Recent Labs  02/17/14 0309 02/18/14 0229  NA 133* 131*  K 4.3 4.3  CL 103 101  CO2 21 19  GLUCOSE 132* 131*  BUN 49* 48*  CREATININE 1.87* 1.86*  CALCIUM 8.3* 8.5     Liver Panel   Recent Labs  02/17/14 0309 02/18/14 0229  PROT 7.4 7.0  ALBUMIN 2.6* 2.6*  AST 39* 27  ALT 17 15  ALKPHOS 98 99  BILITOT 1.7* 1.7*       Sedimentation Rate  Recent Labs  02/18/14 0229  ESRSEDRATE 36*   C-Reactive Protein  Recent Labs  02/18/14 0229  CRP 5.1*    Micro Results: Recent Results (from the past 720 hour(s))  Blood culture (routine x 2)     Status: None   Collection Time: 03/02/2014  9:51 AM  Result Value Ref Range Status   Specimen Description BLOOD RIGHT ARM  Final   Special Requests BOTTLES DRAWN AEROBIC AND ANAEROBIC 3CC  Final   Culture   Final    STREPTOCOCCUS GROUP G Note: SUSCEPTIBILITIES PERFORMED ON PREVIOUS CULTURE WITHIN THE LAST 5 DAYS. Note: Gram Stain Report Called to,Read Back By and Verified With: Ara Kussmaul RN on 02/15/14 at 06:28 by Christie Nottingham Performed at [facility]    Report Status 02/17/2014 FINAL  Final  Blood culture (routine x 2)     Status: None   Collection Time: 02/27/2014 10:33 AM  Result Value Ref Range Status   Specimen Description BLOOD LEFT ARM  Final   Special Requests BOTTLES DRAWN AEROBIC AND ANAEROBIC 10CC  Final   Culture   Final    STREPTOCOCCUS GROUP G Note: Gram Stain Report Called to,Read Back By and Verified With: Ara Kussmaul RN on 02/15/14 at 06:28 by Christie Nottingham Performed at [facility]    Report Status 02/17/2014 FINAL  Final   Organism ID, Bacteria STREPTOCOCCUS GROUP G  Final      Susceptibility   Streptococcus group g - MIC (ETEST)*    PENICILLIN 0.016 SENSITIVE Sensitive     * STREPTOCOCCUS GROUP G  Urine culture     Status: None   Collection Time: 02/13/2014 11:09 AM  Result Value Ref Range Status   Specimen Description URINE, CATHETERIZED  Final   Special  Requests NONE  Final   Colony Count   Final    >=100,000 COLONIES/ML Performed at [facility]    Culture   Final    PROTEUS MIRABILIS Performed at [facility]    Report Status 02/16/2014 FINAL  Final   Organism ID, Bacteria PROTEUS MIRABILIS  Final  Susceptibility   Proteus mirabilis - MIC*    AMPICILLIN <=2 SENSITIVE Sensitive     CEFAZOLIN <=4 SENSITIVE Sensitive     CEFTRIAXONE <=1 SENSITIVE Sensitive     CIPROFLOXACIN <=0.25 SENSITIVE Sensitive     GENTAMICIN <=1 SENSITIVE Sensitive     LEVOFLOXACIN <=0.12 SENSITIVE Sensitive     NITROFURANTOIN 128 RESISTANT Resistant     TOBRAMYCIN <=1 SENSITIVE Sensitive     TRIMETH/SULFA <=20 SENSITIVE Sensitive     PIP/TAZO <=4 SENSITIVE Sensitive     * PROTEUS MIRABILIS  MRSA PCR Screening     Status: None   Collection Time: 2014-03-12  4:21 PM  Result Value Ref Range Status   MRSA by PCR NEGATIVE NEGATIVE Final    Comment:        The GeneXpert MRSA Assay (FDA approved for NASAL specimens only), is one component of a comprehensive MRSA colonization surveillance program. It is not intended to diagnose MRSA infection nor to guide or monitor treatment for MRSA infections.   Culture, blood (routine x 2)     Status: None (Preliminary result)   Collection Time: 02/15/14 12:10 PM  Result Value Ref Range Status   Specimen Description LEFT ANTECUBITAL  Final   Special Requests BOTTLES DRAWN AEROBIC ONLY 5CC  Final   Culture   Final           BLOOD CULTURE RECEIVED NO GROWTH TO DATE CULTURE WILL BE HELD FOR 5 DAYS BEFORE ISSUING A FINAL NEGATIVE REPORT Performed at [facility]    Report Status PENDING  Incomplete  Culture, blood (routine x 2)     Status: None (Preliminary result)   Collection Time: 02/15/14 12:15 PM  Result Value Ref Range Status   Specimen Description BLOOD LEFT HAND  Final   Special Requests BOTTLES DRAWN AEROBIC ONLY 5CC  Final   Culture   Final           BLOOD CULTURE RECEIVED NO  GROWTH TO DATE CULTURE WILL BE HELD FOR 5 DAYS BEFORE ISSUING A FINAL NEGATIVE REPORT Performed at [facility]    Report Status PENDING  Incomplete    Studies/Results: Dg Chest Port 1 View  02/18/2014   CLINICAL DATA:  Right arm PICC line placement. Congestive heart failure.  EXAM: PORTABLE CHEST - 1 VIEW  COMPARISON:  02/15/2014  FINDINGS: Patient is rotated to the left. Low lung volumes noted. A right arm PICC line is seen with tip overlying the distal SVC.  Cardiomegaly and diffuse interstitial edema pattern show no significant change allowing for technical differences.  IMPRESSION: Right arm PICC line tip overlies the distal SVC.   Electronically Signed   By: Myles Rosenthal M.D.   On: 02/18/2014 12:57   Dg Foot Complete Left  02/17/2014   CLINICAL DATA:  Diabetic foot ulcer.  Left foot pain.  EXAM: LEFT FOOT - COMPLETE 3+ VIEW  COMPARISON:  None.  FINDINGS: There is no evidence of fracture or dislocation. Mild spurring of posterior calcaneus is noted. No definite lytic destruction is seen to suggest osteomyelitis no radiopaque foreign body is noted. Soft tissues are unremarkable.  IMPRESSION: No acute abnormality seen in the left foot.   Electronically Signed   By: Roque Lias M.D.   On: 02/17/2014 16:09      Assessment/Plan:  Principal Problem:   Sepsis Active Problems:   Atrial fibrillation with tachycardic ventricular rate   Type 2 diabetes, uncontrolled, with neuropathy   Morbid obesity   Cellulitis and  abscess of leg   Cor pulmonale, chronic   Pulmonary hypertension   Diastolic CHF with preserved left ventricular function, NYHA class 2   Obstructive chronic bronchitis without exacerbation   Smoker   NSTEMI (non-ST elevated myocardial infarction)   Palliative care encounter   Anxiety state   General weakness   Diabetic foot ulcer   Group G streptococcal infection   Renal failure (ARF), acute on chronic   Severe sepsis with acute organ dysfunction    Joy Benitez is a 69 y.o. female with  Group G streptococcal bacteremia with septic shock, NSTEMI, with source of infection being likely cellulitis + possibly infected DFU, possible underlying osteomyelitis   #1 group G streptococcal bacteremia septic shock in the setting of cellulitis and diabetic foot ulcer:  Narrowed to ceftriaxone 2 g Benitez  Repeat blood cultures NGTD, OK for pICC today since NG several days and unlike SA or candida not as hard to clear Strep  Plain films negative, will get CT foot with contrast   Check ABI and toe brachial indices of feet   Diabetic education smoking cessation wound care management, nutrition, case management all engaged via diabetic foot focused order set   I would treat her minimum of 4 weeks in case she has endocarditis due to Group G strep,  certainly longer if she has osteomyelitis underlying DFU     #2 Screening: HIV and viral hepatides negative   LOS: 4 days   Acey Lav 02/18/2014, 3:03 PM

## 2014-02-18 NOTE — Progress Notes (Signed)
INITIAL NUTRITION ASSESSMENT  DOCUMENTATION CODES Per approved criteria  -Morbid Obesity   INTERVENTION:  Glucerna Shake PO TID, each supplement provides 220 kcal and 10 grams of protein.  MVI daily.  NUTRITION DIAGNOSIS: Inadequate oral intake related to decreased alertness as evidenced by minimal intake of meals.   Goal: Intake to meet >90% of estimated nutrition needs.  Monitor:  PO intake, labs, weight trend, wound healing.  Reason for Assessment: MD Consult for wound healing   69 y.o. female  Admitting Dx: Sepsis  ASSESSMENT: Patient admitted on 1/2 with cellulitis, a fib with RVR, fever, and acute demand MI secondary to sepsis. Now with group B strep bacteremia. Recent poor PO intake. Patient unable to provide reliable nutrition history at this time. Per H&P, she was taking Prostat 30 ml TID PTA. Per discussion with RN, patient with decreased alertness this AM; currently having PICC placed. RN fed patient a small amount of breakfast this morning. PO intake remains inadequate.  Height: Ht Readings from Last 1 Encounters:  02/16/14 5\' 5"  (1.651 m)    Weight: Wt Readings from Last 1 Encounters:  02/18/14 393 lb (178.264 kg)    Ideal Body Weight: 56.8 kg  % Ideal Body Weight: 314%  Wt Readings from Last 10 Encounters:  02/18/14 393 lb (178.264 kg)  11/12/13 371 lb 0.6 oz (168.3 kg)    Usual Body Weight: 371 lb  % Usual Body Weight: 106% (weight up with fluid overload)  BMI:  Body mass index is 65.4 kg/(m^2). class 3, extreme/morbid obesity  Estimated Nutritional Needs: Kcal: 2200-2400 Protein: 115-130 gm Fluid: 2.4 L  Skin: diabetic ulcer to left foot  Diet Order: Diet Carb Modified  EDUCATION NEEDS: -Education not appropriate at this time   Intake/Output Summary (Last 24 hours) at 02/18/14 0930 Last data filed at 02/18/14 5374  Gross per 24 hour  Intake 353.43 ml  Output      0 ml  Net 353.43 ml    Last BM: 1/5   Labs:   Recent  Labs Lab 02/15/14 0300 02/17/14 0309 02/18/14 0229  NA 133* 133* 131*  K 4.2 4.3 4.3  CL 104 103 101  CO2 22 21 19   BUN 43* 49* 48*  CREATININE 1.78* 1.87* 1.86*  CALCIUM 8.3* 8.3* 8.5  GLUCOSE 195* 132* 131*    CBG (last 3)   Recent Labs  02/17/14 1658 02/17/14 2024 02/18/14 0829  GLUCAP 122* 138* 115*    Scheduled Meds: . antiseptic oral rinse  7 mL Mouth Rinse BID  . aspirin  81 mg Oral Daily  . atorvastatin  80 mg Oral q1800  . cefTRIAXone (ROCEPHIN)  IV  2 g Intravenous Q24H  . collagenase   Topical Daily  . digoxin  0.0625 mg Oral Daily  . enoxaparin (LOVENOX) injection  30 mg Subcutaneous Q24H  . furosemide  10 mg Oral Daily  . gabapentin  600 mg Oral BID  . hydrocerin  1 application Topical BID  . insulin aspart  0-15 Units Subcutaneous TID WC  . insulin aspart  0-5 Units Subcutaneous QHS  . insulin glargine  15 Units Subcutaneous QHS  . metoprolol succinate  25 mg Oral Daily  . nystatin cream   Topical BID  . senna  1 tablet Oral BID    Continuous Infusions:   Past Medical History  Diagnosis Date  . Atrial fibrillation   . Hypertension   . History of blood transfusion 1975    "w/my daughter's birth"  .  Chronic diastolic CHF (congestive heart failure), NYHA class 2   . Type II diabetes mellitus   . Anemia   . Anxiety   . Arthritis     "hips down" (11/06/2013)  . Diabetic peripheral neuropathy     Past Surgical History  Procedure Laterality Date  . Cholecystectomy  1999  . Incision and drainage abscess  2002    "in my groin"  . Cesarean section  1975; 1985  . Cataract extraction w/ intraocular lens implant Left 2013    Joaquin Courts, RD, LDN, CNSC Pager [phone] After Hours Pager [phone]

## 2014-02-18 NOTE — Progress Notes (Addendum)
Patient Name: Joy Benitez Date of Encounter: 02/18/2014     Principal Problem:   Sepsis Active Problems:   Atrial fibrillation with tachycardic ventricular rate   Type 2 diabetes, uncontrolled, with neuropathy   Morbid obesity   Cellulitis and abscess of leg   Cor pulmonale, chronic   Pulmonary hypertension   Diastolic CHF with preserved left ventricular function, NYHA class 2   Obstructive chronic bronchitis without exacerbation   Smoker   NSTEMI (non-ST elevated myocardial infarction)   Palliative care encounter   Anxiety state   General weakness   Diabetic foot ulcer   Group G streptococcal infection   Renal failure (ARF), acute on chronic   Severe sepsis with acute organ dysfunction    SUBJECTIVE  Did not awake for me this morning. Grandson at bedside. Per nursing staff, she was answering question and alert and oriented x1 this morning. She recognized her grandson as he came in earlier. I was unable to arouse the patient after repeated shaking  CURRENT MEDS . antiseptic oral rinse  7 mL Mouth Rinse BID  . aspirin  81 mg Oral Daily  . atorvastatin  80 mg Oral q1800  . cefTRIAXone (ROCEPHIN)  IV  2 g Intravenous Q24H  . collagenase   Topical Daily  . digoxin  0.0625 mg Oral Daily  . enoxaparin (LOVENOX) injection  30 mg Subcutaneous Q24H  . feeding supplement (GLUCERNA SHAKE)  237 mL Oral TID BM  . furosemide  10 mg Oral Daily  . gabapentin  600 mg Oral BID  . hydrocerin  1 application Topical BID  . insulin aspart  0-15 Units Subcutaneous TID WC  . insulin aspart  0-5 Units Subcutaneous QHS  . insulin glargine  15 Units Subcutaneous QHS  . metoprolol succinate  25 mg Oral Daily  . multivitamin with minerals  1 tablet Oral Daily  . nystatin cream   Topical BID  . senna  1 tablet Oral BID  . sodium chloride  10-40 mL Intracatheter Q12H    OBJECTIVE  Filed Vitals:   02/18/14 0625 02/18/14 0630 02/18/14 0824 02/18/14 0900  BP: 108/75  112/74   Pulse:  96  69 93  Temp:   97.4 F (36.3 C)   TempSrc:   Oral   Resp: 17  16 0  Height:      Weight:  393 lb (178.264 kg)    SpO2: 98%  99% 92%    Intake/Output Summary (Last 24 hours) at 02/18/14 1155 Last data filed at 02/18/14 1610  Gross per 24 hour  Intake    445 ml  Output      0 ml  Net    445 ml   Filed Weights   02/16/2014 1532 02/16/14 0330 02/18/14 0630  Weight: 343 lb 14.7 oz (156 kg) 367 lb (166.47 kg) 393 lb (178.264 kg)    PHYSICAL EXAM  General: Unable to arouse during my exam. Grandson at bedside. Morbidly obese HEENT:  Normal  Neck: Supple without bruits. Unable to assess JVD given excessive amount of neck fat. Lungs:  Resp regular and unlabored, anterior exam CTA. Heart: RRR no s3, s4, or murmurs. Abdomen: Soft, non-distended, BS + x 4.  Extremities: No clubbing, cyanosis. Radial 2+ bilaterally. Significant bilateral LE redness, warm and cellulitis  Accessory Clinical Findings  CBC  Recent Labs  02/17/14 0309 02/18/14 0229  WBC 12.8* 11.9*  HGB 10.9* 11.0*  HCT 35.5* 35.5*  MCV 84.9 86.6  PLT 311 312  Basic Metabolic Panel  Recent Labs  02/17/14 0309 02/18/14 0229  NA 133* 131*  K 4.3 4.3  CL 103 101  CO2 21 19  GLUCOSE 132* 131*  BUN 49* 48*  CREATININE 1.87* 1.86*  CALCIUM 8.3* 8.5   Liver Function Tests  Recent Labs  02/17/14 0309 02/18/14 0229  AST 39* 27  ALT 17 15  ALKPHOS 98 99  BILITOT 1.7* 1.7*  PROT 7.4 7.0  ALBUMIN 2.6* 2.6*   Hemoglobin A1C  Recent Labs  02/18/14 0229  HGBA1C 7.4*   Fasting Lipid Panel  Recent Labs  02/17/14 0309  CHOL 117  HDL 14*  LDLCALC 69  TRIG 308*  CHOLHDL 8.4    TELE NSR with HR 70s    ECG  No new EKG   Echocardiogram 02/16/2014  LV EF: 25% -  30%  ------------------------------------------------------------------- Indications:   Cardiomyopathy- Unspecified (425.9).  ------------------------------------------------------------------- History:  PMH: Anemia.  Atrial fibrillation. Congestive heart failure. Primary pulmonary hypertension. PMH:  Myocardial infarction. Risk factors: Current tobacco use. Diabetes mellitus.  ------------------------------------------------------------------- Study Conclusions  - Left ventricle: The cavity size was mildly dilated. Wall thickness was increased in a pattern of moderate LVH. Systolic function was severely reduced. The estimated ejection fraction was in the range of 25% to 30%. There is akinesis of the anteroseptal myocardium. Doppler parameters are consistent with high ventricular filling pressure. - Mitral valve: Calcified annulus. Mildly thickened leaflets . There was mild regurgitation. - Left atrium: The atrium was moderately dilated. - Right atrium: The atrium was mildly dilated. - Tricuspid valve: There was moderate regurgitation. - Pulmonary arteries: PA peak pressure: 51 mm Hg (S).     Radiology/Studies  Portable Chest 1 View  02/15/2014   CLINICAL DATA:  Shortness of breath.  Morbid obesity.  EXAM: PORTABLE CHEST - 1 VIEW  COMPARISON:  One day prior  FINDINGS: Support apparatus: None  Cardiomediastinal silhouette: Patient moderately rotated left. Cardiomegaly accentuated by AP portable technique.  Pleura: Right costophrenic angle minimally excluded. No gross pleural fluid. No pneumothorax.  Lungs: Low lung volumes with resultant pulmonary interstitial prominence. Improvement in mild interstitial edema. The left lung base is poorly evaluated. Otherwise, no areas of consolidation are identified.  Other: None  IMPRESSION: Improvement in mild congestive heart failure.  Moderately degraded exam, secondary to patient size and positioning.   Electronically Signed   By: Jeronimo Greaves M.D.   On: 02/15/2014 08:22   Dg Chest Port 1 View  02/24/2014   CLINICAL DATA:  Shortness of breath an rapid atrial fibrillation.  EXAM: PORTABLE CHEST - 1 VIEW  COMPARISON:  11/09/2013  FINDINGS: Stable  cardiac enlargement. Diffuse interstitial edema present. No significant pleural fluid identified.  IMPRESSION: Cardiomegaly with diffuse interstitial edema.   Electronically Signed   By: Irish Lack M.D.   On: 03/05/2014 10:54   Dg Foot Complete Left  02/17/2014   CLINICAL DATA:  Diabetic foot ulcer.  Left foot pain.  EXAM: LEFT FOOT - COMPLETE 3+ VIEW  COMPARISON:  None.  FINDINGS: There is no evidence of fracture or dislocation. Mild spurring of posterior calcaneus is noted. No definite lytic destruction is seen to suggest osteomyelitis no radiopaque foreign body is noted. Soft tissues are unremarkable.  IMPRESSION: No acute abnormality seen in the left foot.   Electronically Signed   By: Roque Lias M.D.   On: 02/17/2014 16:09    ASSESSMENT AND PLAN  1. Sepsis with lower extremity cellulitis  - group G strep bacteremia, ID  following  2. NSTEMI: plan for conservative management for now.  - Echo 11/05/2013 EF 50-55%  - Echo 02/16/2014 EF 25-30%, akinesis of anteroseptal myocardium, moderatedly dilated LA, moderate TR, PA peak pressure 51 mmHg.   - once recovered, will consider myoview vs cath. Not a good candidate for cath now given AKI and comorbidities  3. A-fib: rate controlled on metoprolol and digoxin  - she refused OAC in the past, will need to reconsider following ischemic eval  - lasix on hold due to hypotension, can likely resume today given SBP 90-110s, she's taking  BID at home, only  PO lasix was ordered for now by IM, potentially increase the dose, if she cannot take PO with mental status, may need to switch to IV  4. AKI  5. COPD/Tobacco abuse/Pulm HTN 6. DM 7. Morbid obesity  Signed, Azalee Course PA-C Pager: [phone] As above, patient seen and examined. She is confused but responds to questions today. Continue antibiotics for cellulitis. As outlined previously I do not think she is a good candidate for cardiac catheterization. Continue aspirin and statin. Continue  low-dose beta blocker. Will consider ACEI later if renal function improves; if not, add hydralazine/nitrates if BP allows. Atrial fibrillation persists. Continue beta blocker and low-dose digoxin for rate control. She has declined anticoagulation in the past. This can be reconsidered at discharge if she improves. Will resume Lasix at low dose. Long-term prognosis appears to be poor. Olga Millers

## 2014-02-18 NOTE — Progress Notes (Signed)
Ms. Bernett is very sleepy and difficult to arouse. She is also confused when she does speak. Seems she did not tolerate medication for sleep very well - I hope that her mental state will improve for tomorrow. She has multiple medical issues and very complicated situation. I hope to have the opportunity to speak with her tomorrow tomorrow.   Yong Channel, NP Palliative Medicine Team Pager # [phone] (M-F 8a-5p) Team Phone # [phone] (Nights/Weekends)

## 2014-02-18 NOTE — Progress Notes (Signed)
Peripherally Inserted Central Catheter/Midline Placement  The IV Nurse has discussed with the patient and/or persons authorized to consent for the patient, the purpose of this procedure and the potential benefits and risks involved with this procedure.  The benefits include less needle sticks, lab draws from the catheter and patient may be discharged home with the catheter.  Risks include, but not limited to, infection, bleeding, blood clot (thrombus formation), and puncture of an artery; nerve damage and irregular heat beat.  Alternatives to this procedure were also discussed.  PICC/Midline Placement Documentation    Information given to husband Aleise Toolan.    Franne Grip Renee 02/18/2014, 10:52 AM

## 2014-02-18 NOTE — Progress Notes (Signed)
ANTICOAGULATION CONSULT NOTE - Follow Up Consult  Pharmacy Consult for heparin Indication: atrial fibrillation  Allergies  Allergen Reactions  . Levaquin [Levofloxacin In D5w] Itching    Patient Measurements: Height: 5\' 5"  (165.1 cm) Weight: (!) 393 lb (178.264 kg) IBW/kg (Calculated) : 57 Heparin Dosing Weight: 96kg  Vital Signs: Temp: 97.5 F (36.4 C) (01/06 0400) Temp Source: Oral (01/06 0400) BP: 108/75 mmHg (01/06 0625) Pulse Rate: 96 (01/06 0625)  Labs:  Recent Labs  02/16/14 0349 02/16/14 1334 02/17/14 0309 02/18/14 0229  HGB 10.3*  --  10.9* 11.0*  HCT 33.9*  --  35.5* 35.5*  PLT 294  --  311 312  HEPARINUNFRC 0.24* 0.51 0.42 0.59  CREATININE  --   --  1.87* 1.86*    Estimated Creatinine Clearance: 49.6 mL/min (by C-G formula based on Cr of 1.86).   Medications:  Heparin @ 2300 units/hr  Assessment: Joy Benitez brought in today with rapid AFib. Started on heparin. H/H remains low but stable. Platelets are within normal limits. No signs or symptoms of bleeding per RN.  Heparin level remains therapeutic on 2300 units/hr.   Goal of Therapy:  Heparin level 0.3-0.7 units/ml Monitor platelets by anticoagulation protocol: Yes   Plan: Continue heparin infusion at 2300 units/hr  Monitor daily HL, CBC and s/s of bleeding. Follow-up plans for oral anticoagulation.    Link Snuffer, PharmD, BCPS Clinical Pharmacist [phone] 02/18/2014, 8:16 AM

## 2014-02-18 NOTE — Progress Notes (Signed)
Received Diabetes Coordinator consult per "diabetic foot ulcer" protocol. Note that patient was admitted with cellulitis, peripheral neuropathy, falling at home. Takes Lantus 18 units daily, Novolog 0-20 units TID?, and Metformin at home. Is 178 kg in weight. HgbA1C is 7.4%. Current meds: Lantus 15 units every HS, Novolog MODERATE correction scale TID & HS. CBGs less than 180 mg/dl.  Will follow while in hospital. Smith Mince RN BSN CDE

## 2014-02-19 DIAGNOSIS — J9600 Acute respiratory failure, unspecified whether with hypoxia or hypercapnia: Secondary | ICD-10-CM

## 2014-02-19 DIAGNOSIS — L97409 Non-pressure chronic ulcer of unspecified heel and midfoot with unspecified severity: Secondary | ICD-10-CM

## 2014-02-19 DIAGNOSIS — R7881 Bacteremia: Secondary | ICD-10-CM

## 2014-02-19 DIAGNOSIS — B954 Other streptococcus as the cause of diseases classified elsewhere: Secondary | ICD-10-CM

## 2014-02-19 DIAGNOSIS — I252 Old myocardial infarction: Secondary | ICD-10-CM

## 2014-02-19 DIAGNOSIS — E11621 Type 2 diabetes mellitus with foot ulcer: Secondary | ICD-10-CM

## 2014-02-19 DIAGNOSIS — I4891 Unspecified atrial fibrillation: Secondary | ICD-10-CM

## 2014-02-19 DIAGNOSIS — R6521 Severe sepsis with septic shock: Secondary | ICD-10-CM

## 2014-02-19 DIAGNOSIS — L0390 Cellulitis, unspecified: Secondary | ICD-10-CM | POA: Insufficient documentation

## 2014-02-19 LAB — BLOOD GAS, ARTERIAL
ACID-BASE DEFICIT: 5.1 mmol/L — AB (ref 0.0–2.0)
BICARBONATE: 20.4 meq/L (ref 20.0–24.0)
Drawn by: 406621
O2 Content: 2 L/min
O2 SAT: 93.8 %
PATIENT TEMPERATURE: 98.6
PCO2 ART: 43.7 mmHg (ref 35.0–45.0)
PO2 ART: 74.6 mmHg — AB (ref 80.0–100.0)
TCO2: 21.7 mmol/L (ref 0–100)
pH, Arterial: 7.29 — ABNORMAL LOW (ref 7.350–7.450)

## 2014-02-19 LAB — COMPREHENSIVE METABOLIC PANEL
ALK PHOS: 98 U/L (ref 39–117)
ALT: 14 U/L (ref 0–35)
AST: 22 U/L (ref 0–37)
Albumin: 2.6 g/dL — ABNORMAL LOW (ref 3.5–5.2)
Anion gap: 6 (ref 5–15)
BILIRUBIN TOTAL: 1.7 mg/dL — AB (ref 0.3–1.2)
BUN: 44 mg/dL — AB (ref 6–23)
CHLORIDE: 104 meq/L (ref 96–112)
CO2: 24 mmol/L (ref 19–32)
Calcium: 8.9 mg/dL (ref 8.4–10.5)
Creatinine, Ser: 1.55 mg/dL — ABNORMAL HIGH (ref 0.50–1.10)
GFR calc Af Amer: 39 mL/min — ABNORMAL LOW (ref >90)
GFR calc non Af Amer: 34 mL/min — ABNORMAL LOW (ref >90)
Glucose, Bld: 135 mg/dL — ABNORMAL HIGH (ref 70–99)
Potassium: 4.7 mmol/L (ref 3.5–5.1)
Sodium: 134 mmol/L — ABNORMAL LOW (ref 135–145)
Total Protein: 7.5 g/dL (ref 6.0–8.3)

## 2014-02-19 LAB — CBC
HEMATOCRIT: 34.9 % — AB (ref 36.0–46.0)
Hemoglobin: 10.5 g/dL — ABNORMAL LOW (ref 12.0–15.0)
MCH: 25.5 pg — ABNORMAL LOW (ref 26.0–34.0)
MCHC: 30.1 g/dL (ref 30.0–36.0)
MCV: 84.9 fL (ref 78.0–100.0)
Platelets: 305 10*3/uL (ref 150–400)
RBC: 4.11 MIL/uL (ref 3.87–5.11)
RDW: 19.2 % — AB (ref 11.5–15.5)
WBC: 12.5 10*3/uL — AB (ref 4.0–10.5)

## 2014-02-19 LAB — GLUCOSE, CAPILLARY
GLUCOSE-CAPILLARY: 145 mg/dL — AB (ref 70–99)
Glucose-Capillary: 129 mg/dL — ABNORMAL HIGH (ref 70–99)
Glucose-Capillary: 114 mg/dL — ABNORMAL HIGH (ref 70–99)

## 2014-02-19 MED ORDER — LORAZEPAM 2 MG/ML IJ SOLN: 0.5000 mg | INTRAMUSCULAR | Status: DC | PRN

## 2014-02-19 MED ORDER — FUROSEMIDE 10 MG/ML IJ SOLN
40.0000 mg | Freq: Two times a day (BID) | INTRAMUSCULAR | Status: DC
  Administered 2014-02-19 – 2014-02-20 (×3): 40 mg via INTRAVENOUS
  Filled 2014-02-19 (×6): qty 4

## 2014-02-19 MED ORDER — INSULIN GLARGINE 100 UNIT/ML ~~LOC~~ SOLN
10.0000 [IU] | Freq: Every day | SUBCUTANEOUS | Status: DC
  Administered 2014-02-19: 10 [IU] via SUBCUTANEOUS
  Filled 2014-02-19 (×2): qty 0.1

## 2014-02-19 MED ORDER — FUROSEMIDE 10 MG/ML IJ SOLN
20.0000 mg | Freq: Once | INTRAMUSCULAR | Status: AC
  Administered 2014-02-19: 20 mg via INTRAVENOUS
  Filled 2014-02-19: qty 2

## 2014-02-19 MED ORDER — LACTULOSE 10 GM/15ML PO SOLN
10.0000 g | Freq: Every day | ORAL | Status: DC
  Filled 2014-02-19 (×3): qty 15

## 2014-02-19 MED ORDER — LORAZEPAM 2 MG/ML IJ SOLN
0.5000 mg | Freq: Four times a day (QID) | INTRAMUSCULAR | Status: DC | PRN
  Administered 2014-02-19 – 2014-02-20 (×3): 0.5 mg via INTRAVENOUS
  Filled 2014-02-19 (×3): qty 1

## 2014-02-19 NOTE — Progress Notes (Signed)
I have left a message for Mr. Joy Benitez to talk with him. I do believe Joy Benitez has declined greatly and is approaching end of life and we will need to discuss comfort options. I know that she has told me in previous discussion that she was accepting of death when it was her time but wanted to continue to fight for now (seemed to be more for her family). Hopefully I will hear back from family soon.   Yong Channel, NP Palliative Medicine Team Pager # [phone] (M-F 8a-5p) Team Phone # [phone] (Nights/Weekends)

## 2014-02-19 NOTE — Clinical Social Work Psychosocial (Signed)
Clinical Social Work Department BRIEF PSYCHOSOCIAL ASSESSMENT 02/19/2014  Patient:  Joy Benitez, Joy Benitez     Account Number:  [id number]     Admit date:  12-Mar-2014  Clinical Social Worker:  Merlyn Lot, CLINICAL SOCIAL WORKER  Date/Time:  02/19/2014 04:06 PM  Referred by:  Physician  Date Referred:  02/19/2014 Referred for  SNF Placement   Other Referral:   Interview type:  Family Other interview type:    PSYCHOSOCIAL DATA Living Status:  HUSBAND Admitted from facility:   Level of care:   Primary support name:  Joy Benitez Primary support relationship to patient:  SPOUSE Degree of support available:   high level of support    CURRENT CONCERNS Current Concerns  Post-Acute Placement   Other Concerns:    SOCIAL WORK ASSESSMENT / PLAN CSW spoke with pt husband over the phone- he is agreeable to SNF search.  States that patinet has stayed at [facility] in the past.  CSW will continue to follow.   Assessment/plan status:  Psychosocial Support/Ongoing Assessment of Needs Other assessment/ plan:   FL2 update   Information/referral to community resources:   [facility] SNF    PATIENT'S/FAMILY'S RESPONSE TO PLAN OF CARE: Patients husband is agreeable to SNF search but is hopeful that patient will recover and be able to come home.       Merlyn Lot, LCSWA Clinical Social Worker [phone]

## 2014-02-19 NOTE — Progress Notes (Signed)
Chaplain visited with Joy Benitez via consult. While with her her she cried incessantly " Oh God, Oh God, what is wrong with me? Help me, help me"   When asked what does she want me to help her with, she replied "get me out of here and tell me what's wrong with me". She moves in and out of sleep. I walked out as she fell asleep once again.   Chaplain attempted to provide emotional support yet it seems like she just wanted to lament.   Page if needed  Vickii Penna 02/19/2014

## 2014-02-19 NOTE — Clinical Social Work Note (Addendum)
4:00pm CSW returned Joy Benitez call and was informed that he is at bedside.  CSW went to visit with Joy Benitez and answer his questions and Joy Benitez stated that he no longer needed CSW involvement because the patient is dying.  Joy Benitez was tearful and stated that they all needed prayers at this time- CSW informed Joy Benitez of chaplain services and encouraged him to inform the nurse if the family needed support from Chaplain or CSW.   1:15pm Patients husband informed nurse that he would like to speak with CSW- CSW attempted to call- unable to connect an will attempt later.  8:45am Patients husband returned call.  Pt spouse is agreeable to SNF- states that patient was at [facility] in the past.  CSW completed bed search- bed offers pending  8:30am CSW attempted to contact patients spouse to discuss SNF placement- left message.  CSW signing off for now.  Joy Benitez, LCSWA Clinical Social Worker [phone]

## 2014-02-19 NOTE — Clinical Social Work Placement (Signed)
Clinical Social Work Department CLINICAL SOCIAL WORK PLACEMENT NOTE 02/19/2014  Patient:  Joy Benitez, Joy Benitez  Account Number:  [id number] Admit date:  February 24, 2014  Clinical Social Worker:  Merlyn Lot, CLINICAL SOCIAL WORKER  Date/time:  02/19/2014 04:10 PM  Clinical Social Work is seeking post-discharge placement for this patient at the following level of care:   SKILLED NURSING   (*CSW will update this form in Epic as items are completed)   02/19/2014  Patient/family provided with [facility] Department of Clinical Social Work's list of facilities offering this level of care within the geographic area requested by the patient (or if unable, by the patient's family).  02/19/2014  Patient/family informed of their freedom to choose among providers that offer the needed level of care, that participate in Medicare, Medicaid or managed care program needed by the patient, have an available bed and are willing to accept the patient.  02/19/2014  Patient/family informed of [facility]' ownership interest in [facility], as well as of the fact that they are under no obligation to receive care at this facility.  PASARR submitted to EDS on 02/19/2014 PASARR number received on 02/19/2014  FL2 transmitted to all facilities in geographic area requested by pt/family on  02/19/2014 FL2 transmitted to all facilities within larger geographic area on   Patient informed that his/her managed care company has contracts with or will negotiate with  certain facilities, including the following:     Patient/family informed of bed offers received:   Patient chooses bed at  Physician recommends and patient chooses bed at    Patient to be transferred to  on   Patient to be transferred to facility by  Patient and family notified of transfer on  Name of family member notified:    The following physician request were entered in Epic:   Additional Comments:

## 2014-02-19 NOTE — Progress Notes (Signed)
Patient Name: Joy Benitez Date of Encounter: 02/19/2014     Principal Problem:   Sepsis Active Problems:   Atrial fibrillation with tachycardic ventricular rate   Type 2 diabetes, uncontrolled, with neuropathy   Morbid obesity   Cellulitis and abscess of leg   Cor pulmonale, chronic   Pulmonary hypertension   Diastolic CHF with preserved left ventricular function, NYHA class 2   Obstructive chronic bronchitis without exacerbation   Smoker   NSTEMI (non-ST elevated myocardial infarction)   Palliative care encounter   Anxiety state   General weakness   Diabetic foot ulcer   Group G streptococcal infection   Renal failure (ARF), acute on chronic   Severe sepsis with acute organ dysfunction   Cellulitis    SUBJECTIVE  Opens eyes but does not respond appropriately; on BIPAP  CURRENT MEDS . antiseptic oral rinse  7 mL Mouth Rinse BID  . aspirin  81 mg Oral Daily  . atorvastatin  80 mg Oral q1800  . cefTRIAXone (ROCEPHIN)  IV  2 g Intravenous Q24H  . collagenase   Topical Daily  . digoxin  0.0625 mg Oral Daily  . enoxaparin (LOVENOX) injection  30 mg Subcutaneous Q24H  . feeding supplement (GLUCERNA SHAKE)  237 mL Oral TID BM  . furosemide  40 mg Oral Daily  . gabapentin  600 mg Oral BID  . hydrocerin  1 application Topical BID  . insulin aspart  0-15 Units Subcutaneous TID WC  . insulin aspart  0-5 Units Subcutaneous QHS  . insulin glargine  10 Units Subcutaneous QHS  . lactulose  10 g Oral Daily  . metoprolol succinate  25 mg Oral Daily  . multivitamin with minerals  1 tablet Oral Daily  . nystatin cream   Topical BID  . senna  1 tablet Oral BID  . sodium chloride  10-40 mL Intracatheter Q12H    OBJECTIVE  Filed Vitals:   02/19/14 0931 02/19/14 1000 02/19/14 1143 02/19/14 1159  BP:  115/68 115/68 112/68  Pulse: 101 71 80 69  Temp:    97.4 F (36.3 C)  TempSrc:    Axillary  Resp: Height:      Weight:      SpO2: 99% 99% 97% 96%     Intake/Output Summary (Last 24 hours) at 02/19/14 1246 Last data filed at 02/19/14 0935  Gross per 24 hour  Intake    350 ml  Output      0 ml  Net    350 ml   Filed Weights   02/16/14 0330 02/18/14 0630 02/19/14 0029  Weight: 367 lb (166.47 kg) 393 lb (178.264 kg) 382 lb (173.274 kg)    PHYSICAL EXAM  General: Morbidly obese on BIPAP HEENT:  Normal  Neck: Supple  Lungs:  CTA anteriorly Heart: irregular Abdomen: Soft, non-distended, BS + x 4. Abd wall edema noted Extremities: 1+ edeme; chronic skin change; erythema Neuro: not assessed as pt poorly responsive  Accessory Clinical Findings  CBC  Recent Labs  02/18/14 0229 02/19/14 0410  WBC 11.9* 12.5*  HGB 11.0* 10.5*  HCT 35.5* 34.9*  MCV 86.6 84.9  PLT 312 305   Basic Metabolic Panel  Recent Labs  02/18/14 0229 02/19/14 0410  NA 131* 134*  K 4.3 4.7  CL 101 104  CO2 19 24  GLUCOSE 131* 135*  BUN 48* 44*  CREATININE 1.86* 1.55*  CALCIUM 8.5 8.9   Liver Function Tests  Recent Labs  02/18/14 0229 02/19/14 0410  AST 27 22  ALT 15 14  ALKPHOS 99 98  BILITOT 1.7* 1.7*  PROT 7.0 7.5  ALBUMIN 2.6* 2.6*   Hemoglobin A1C  Recent Labs  02/18/14 0229  HGBA1C 7.4*   Fasting Lipid Panel  Recent Labs  02/17/14 0309  CHOL 117  HDL 14*  LDLCALC 69  TRIG 161*  CHOLHDL 8.4    TELE NSR with HR 70s    ECG  No new EKG   Echocardiogram 02/16/2014  LV EF: 25% -  30%  ------------------------------------------------------------------- Indications:   Cardiomyopathy- Unspecified (425.9).  ------------------------------------------------------------------- History:  PMH: Anemia. Atrial fibrillation. Congestive heart failure. Primary pulmonary hypertension. PMH:  Myocardial infarction. Risk factors: Current tobacco use. Diabetes mellitus.  ------------------------------------------------------------------- Study Conclusions  - Left ventricle: The cavity size was mildly  dilated. Wall thickness was increased in a pattern of moderate LVH. Systolic function was severely reduced. The estimated ejection fraction was in the range of 25% to 30%. There is akinesis of the anteroseptal myocardium. Doppler parameters are consistent with high ventricular filling pressure. - Mitral valve: Calcified annulus. Mildly thickened leaflets . There was mild regurgitation. - Left atrium: The atrium was moderately dilated. - Right atrium: The atrium was mildly dilated. - Tricuspid valve: There was moderate regurgitation. - Pulmonary arteries: PA peak pressure: 51 mm Hg (S).     Radiology/Studies  Ct Foot Left Wo Contrast  02/18/2014   CLINICAL DATA:  Diabetic foot ulcer.  Peripheral neuropathy.  EXAM: CT OF THE LEFT FOOT WITHOUT CONTRAST  TECHNIQUE: Multidetector CT imaging of the left foot was performed according to the standard protocol. Multiplanar CT image reconstructions were also generated.  COMPARISON:  Radiographs dated 02/17/2013 There is a 10 x 14 mm superficial soft tissue ulceration on the dorsum of the foot at the level of the mid first and second metatarsals. There is no appreciable underlying abscess. There is no osteomyelitis. The underlying tendons and muscles appear normal.  The areas extensive motion artifact during the scan but I feel but the study is diagnostic for the area of the ulcer. The hindfoot is not seen due to patient movement during the scan. She was unable to hold still.  FINDINGS: Suboptimal CT scan of the foot with no visualization of the hindfoot. However, there is no evidence of osteomyelitis or abscess of the forefoot. Superficial soft tissue ulceration on the dorsum of the foot.   Electronically Signed   By: Geanie Cooley M.D.   On: 02/18/2014 15:39   Dg Chest Port 1 View  02/18/2014   CLINICAL DATA:  Right arm PICC line placement. Congestive heart failure.  EXAM: PORTABLE CHEST - 1 VIEW  COMPARISON:  02/15/2014  FINDINGS: Patient is  rotated to the left. Low lung volumes noted. A right arm PICC line is seen with tip overlying the distal SVC.  Cardiomegaly and diffuse interstitial edema pattern show no significant change allowing for technical differences.  IMPRESSION: Right arm PICC line tip overlies the distal SVC.   Electronically Signed   By: Myles Rosenthal M.D.   On: 02/18/2014 12:57   Portable Chest 1 View  02/15/2014   CLINICAL DATA:  Shortness of breath.  Morbid obesity.  EXAM: PORTABLE CHEST - 1 VIEW  COMPARISON:  One day prior  FINDINGS: Support apparatus: None  Cardiomediastinal silhouette: Patient moderately rotated left. Cardiomegaly accentuated by AP portable technique.  Pleura: Right costophrenic angle minimally excluded. No gross pleural fluid. No pneumothorax.  Lungs: Low lung volumes with  resultant pulmonary interstitial prominence. Improvement in mild interstitial edema. The left lung base is poorly evaluated. Otherwise, no areas of consolidation are identified.  Other: None  IMPRESSION: Improvement in mild congestive heart failure.  Moderately degraded exam, secondary to patient size and positioning.   Electronically Signed   By: Jeronimo Greaves M.D.   On: 02/15/2014 08:22   Dg Chest Port 1 View  02/24/14   CLINICAL DATA:  Shortness of breath an rapid atrial fibrillation.  EXAM: PORTABLE CHEST - 1 VIEW  COMPARISON:  11/09/2013  FINDINGS: Stable cardiac enlargement. Diffuse interstitial edema present. No significant pleural fluid identified.  IMPRESSION: Cardiomegaly with diffuse interstitial edema.   Electronically Signed   By: Irish Lack M.D.   On: Feb 24, 2014 10:54   Dg Foot Complete Left  02/17/2014   CLINICAL DATA:  Diabetic foot ulcer.  Left foot pain.  EXAM: LEFT FOOT - COMPLETE 3+ VIEW  COMPARISON:  None.  FINDINGS: There is no evidence of fracture or dislocation. Mild spurring of posterior calcaneus is noted. No definite lytic destruction is seen to suggest osteomyelitis no radiopaque foreign body is noted. Soft  tissues are unremarkable.  IMPRESSION: No acute abnormality seen in the left foot.   Electronically Signed   By: Roque Lias M.D.   On: 02/17/2014 16:09    ASSESSMENT AND PLAN  1. Sepsis with lower extremity cellulitis  - group G strep bacteremia, ID following, continue antibiotics  2. NSTEMI: plan for conservative management for now. Continue ASA, toprol and statin. No ACEI for now unless renal insuff improves.  - Echo 11/05/2013 EF 50-55%  - Echo 02/16/2014 EF 25-30%, akinesis of anteroseptal myocardium, moderatedly dilated LA, moderate TR, PA peak pressure 51 mmHg.   - if recovers, will consider myoview vs cath. Not a good candidate for cath now given AKI and comorbidities  3. A-fib: rate controlled on metoprolol and digoxin  - she refused OAC in the past, will need to reconsider following ischemic eval  4. AKI; renal function stable today  5. COPD/Tobacco abuse/Pulm HTN  6. DM  7. Morbid obesity  8. Acute systolic CHF: patient volume overloaded; change lasix to 40 mg IV BID; follow renal function.  Long-term prognosis appears to be poor.  Milus Height

## 2014-02-19 NOTE — Progress Notes (Signed)
[facility]    Subjective:  Patient is somnolent on BiPAP  Antibiotics:  Anti-infectives    Start     Dose/Rate Route Frequency Ordered Stop   02/17/14 1000  cefTRIAXone (ROCEPHIN) 2 g in dextrose 5 % 50 mL IVPB - Premix     2 g100 mL/hr over 30 Minutes Intravenous Every 24 hours 02/17/14 0907     02/15/14 1200  vancomycin (VANCOCIN) 2,500 mg in sodium chloride 0.9 % 500 mL IVPB  Status:  Discontinued     2,500 mg250 mL/hr over 120 Minutes Intravenous Every 24 hours 05-Mar-2014 1555 03-05-2014 1608   02/15/14 0200  vancomycin (VANCOCIN) 1,250 mg in sodium chloride 0.9 % 250 mL IVPB  Status:  Discontinued     1,250 mg166.7 mL/hr over 90 Minutes Intravenous Every 12 hours 03-05-14 1412 02/17/14 0907   03/05/14 2200  piperacillin-tazobactam (ZOSYN) IVPB 3.375 g  Status:  Discontinued     3.375 g12.5 mL/hr over 240 Minutes Intravenous Every 8 hours 03-05-2014 1412 02/17/14 0907   03/05/14 1800  piperacillin-tazobactam (ZOSYN) IVPB 3.375 g  Status:  Discontinued     3.375 g100 mL/hr over 30 Minutes Intravenous 4 times per day 03/05/14 1555 05-Mar-2014 1608   2014-03-05 1145  piperacillin-tazobactam (ZOSYN) IVPB 3.375 g     3.375 g100 mL/hr over 30 Minutes Intravenous  Once 2014/03/05 1130 2014/03/05 1238   2014/03/05 1145  vancomycin (VANCOCIN) 2,500 mg in sodium chloride 0.9 % 500 mL IVPB     2,500 mg250 mL/hr over 120 Minutes Intravenous  Once 03/05/14 1130 03/05/2014 1405      Medications: Scheduled Meds: . antiseptic oral rinse  7 mL Mouth Rinse BID  . aspirin  81 mg Oral Daily  . atorvastatin  80 mg Oral q1800  . cefTRIAXone (ROCEPHIN)  IV  2 g Intravenous Q24H  . collagenase   Topical Daily  . digoxin  0.0625 mg Oral Daily  . enoxaparin (LOVENOX) injection  30 mg Subcutaneous Q24H  . feeding supplement (GLUCERNA SHAKE)  237 mL Oral TID BM  . furosemide  40 mg Oral Daily  . gabapentin  600 mg Oral BID  . hydrocerin  1 application Topical BID  . insulin aspart   0-15 Units Subcutaneous TID WC  . insulin aspart  0-5 Units Subcutaneous QHS  . insulin glargine  10 Units Subcutaneous QHS  . lactulose  10 g Oral Daily  . metoprolol succinate  25 mg Oral Daily  . multivitamin with minerals  1 tablet Oral Daily  . nystatin cream   Topical BID  . senna  1 tablet Oral BID  . sodium chloride  10-40 mL Intracatheter Q12H   Continuous Infusions:  PRN Meds:.acetaminophen **OR** acetaminophen, albuterol, ALPRAZolam, HYDROcodone-acetaminophen, morphine injection, sodium chloride    Objective: Weight change: -11 lb (-4.99 kg)  Intake/Output Summary (Last 24 hours) at 02/19/14 1139 Last data filed at 02/19/14 0935  Gross per 24 hour  Intake    350 ml  Output      0 ml  Net    350 ml   Blood pressure 115/68, pulse 71, temperature 97.6 F (36.4 C), temperature source Oral, resp. rate 10, height 5\' 5"  (1.651 m), weight 382 lb (173.274 kg), SpO2 99 %. Temp:  [96.4 F (35.8 C)-97.6 F (36.4 C)] 97.6 F (36.4 C) (01/07 0846) Pulse Rate:  [25-157] 71 (01/07 1000) Resp:  [10-19] 10 (01/07 1000) BP: (95-119)/(55-76) 115/68 mmHg (01/07 1000) SpO2:  [85 %-100 %]  99 % (01/07 1000) Weight:  [382 lb (173.274 kg)] 382 lb (173.274 kg) (01/07 0029)  Physical Exam: General: Patient appears to have worsening color since I saw her yesterday she is on the BiPAP and somnolent she only woke up slightly with sternal rub, he is apparently deteriorating and is a DO NOT RESUSCITATE patient , EOMI CVS tachy rate irr irr Chest: rhonchi Abdomen: ,morbidly obese nondistended, normal bowel sounds, Extremities, skin: no change in ulcer that is bandaged   Neuro: nonfocal  CBC:  CBC Latest Ref Rng 02/19/2014 02/18/2014 02/17/2014  WBC 4.0 - 10.5 K/uL 12.5(H) 11.9(H) 12.8(H)  Hemoglobin 12.0 - 15.0 g/dL 10.5(L) 11.0(L) 10.9(L)  Hematocrit 36.0 - 46.0 % 34.9(L) 35.5(L) 35.5(L)  Platelets 150 - 400 K/uL 305 312 311      BMET  Recent Labs  02/18/14 0229 02/19/14 0410  NA  131* 134*  K 4.3 4.7  CL 101 104  CO2 19 24  GLUCOSE 131* 135*  BUN 48* 44*  CREATININE 1.86* 1.55*  CALCIUM 8.5 8.9     Liver Panel   Recent Labs  02/18/14 0229 02/19/14 0410  PROT 7.0 7.5  ALBUMIN 2.6* 2.6*  AST 27 22  ALT 15 14  ALKPHOS 99 98  BILITOT 1.7* 1.7*       Sedimentation Rate  Recent Labs  02/18/14 0229  ESRSEDRATE 36*   C-Reactive Protein  Recent Labs  02/18/14 0229  CRP 5.1*    Micro Results: Recent Results (from the past 720 hour(s))  Blood culture (routine x 2)     Status: None   Collection Time: 03/13/2014  9:51 AM  Result Value Ref Range Status   Specimen Description BLOOD RIGHT ARM  Final   Special Requests BOTTLES DRAWN AEROBIC AND ANAEROBIC 3CC  Final   Culture   Final    STREPTOCOCCUS GROUP G Note: SUSCEPTIBILITIES PERFORMED ON PREVIOUS CULTURE WITHIN THE LAST 5 DAYS. Note: Gram Stain Report Called to,Read Back By and Verified With: Ara Kussmaul RN on 02/15/14 at 06:28 by Christie Nottingham Performed at [facility]    Report Status 02/17/2014 FINAL  Final  Blood culture (routine x 2)     Status: None   Collection Time: 03/01/2014 10:33 AM  Result Value Ref Range Status   Specimen Description BLOOD LEFT ARM  Final   Special Requests BOTTLES DRAWN AEROBIC AND ANAEROBIC 10CC  Final   Culture   Final    STREPTOCOCCUS GROUP G Note: Gram Stain Report Called to,Read Back By and Verified With: Ara Kussmaul RN on 02/15/14 at 06:28 by Christie Nottingham Performed at [facility]    Report Status 02/17/2014 FINAL  Final   Organism ID, Bacteria STREPTOCOCCUS GROUP G  Final      Susceptibility   Streptococcus group g - MIC (ETEST)*    PENICILLIN 0.016 SENSITIVE Sensitive     * STREPTOCOCCUS GROUP G  Urine culture     Status: None   Collection Time: 02/24/2014 11:09 AM  Result Value Ref Range Status   Specimen Description URINE, CATHETERIZED  Final   Special Requests NONE  Final   Colony Count   Final    >=100,000  COLONIES/ML Performed at [facility]    Culture   Final    PROTEUS MIRABILIS Performed at [facility]    Report Status 02/16/2014 FINAL  Final   Organism ID, Bacteria PROTEUS MIRABILIS  Final      Susceptibility   Proteus mirabilis - MIC*  AMPICILLIN <=2 SENSITIVE Sensitive     CEFAZOLIN <=4 SENSITIVE Sensitive     CEFTRIAXONE <=1 SENSITIVE Sensitive     CIPROFLOXACIN <=0.25 SENSITIVE Sensitive     GENTAMICIN <=1 SENSITIVE Sensitive     LEVOFLOXACIN <=0.12 SENSITIVE Sensitive     NITROFURANTOIN 128 RESISTANT Resistant     TOBRAMYCIN <=1 SENSITIVE Sensitive     TRIMETH/SULFA <=20 SENSITIVE Sensitive     PIP/TAZO <=4 SENSITIVE Sensitive     * PROTEUS MIRABILIS  MRSA PCR Screening     Status: None   Collection Time: 03-13-14  4:21 PM  Result Value Ref Range Status   MRSA by PCR NEGATIVE NEGATIVE Final    Comment:        The GeneXpert MRSA Assay (FDA approved for NASAL specimens only), is one component of a comprehensive MRSA colonization surveillance program. It is not intended to diagnose MRSA infection nor to guide or monitor treatment for MRSA infections.   Culture, blood (routine x 2)     Status: None (Preliminary result)   Collection Time: 02/15/14 12:10 PM  Result Value Ref Range Status   Specimen Description LEFT ANTECUBITAL  Final   Special Requests BOTTLES DRAWN AEROBIC ONLY 5CC  Final   Culture   Final           BLOOD CULTURE RECEIVED NO GROWTH TO DATE CULTURE WILL BE HELD FOR 5 DAYS BEFORE ISSUING A FINAL NEGATIVE REPORT Performed at [facility]    Report Status PENDING  Incomplete  Culture, blood (routine x 2)     Status: None (Preliminary result)   Collection Time: 02/15/14 12:15 PM  Result Value Ref Range Status   Specimen Description BLOOD LEFT HAND  Final   Special Requests BOTTLES DRAWN AEROBIC ONLY 5CC  Final   Culture   Final           BLOOD CULTURE RECEIVED NO GROWTH TO DATE CULTURE WILL BE HELD FOR 5 DAYS BEFORE  ISSUING A FINAL NEGATIVE REPORT Performed at [facility]    Report Status PENDING  Incomplete    Studies/Results: Ct Foot Left Wo Contrast  02/18/2014   CLINICAL DATA:  Diabetic foot ulcer.  Peripheral neuropathy.  EXAM: CT OF THE LEFT FOOT WITHOUT CONTRAST  TECHNIQUE: Multidetector CT imaging of the left foot was performed according to the standard protocol. Multiplanar CT image reconstructions were also generated.  COMPARISON:  Radiographs dated 02/17/2013 There is a 10 x 14 mm superficial soft tissue ulceration on the dorsum of the foot at the level of the mid first and second metatarsals. There is no appreciable underlying abscess. There is no osteomyelitis. The underlying tendons and muscles appear normal.  The areas extensive motion artifact during the scan but I feel but the study is diagnostic for the area of the ulcer. The hindfoot is not seen due to patient movement during the scan. She was unable to hold still.  FINDINGS: Suboptimal CT scan of the foot with no visualization of the hindfoot. However, there is no evidence of osteomyelitis or abscess of the forefoot. Superficial soft tissue ulceration on the dorsum of the foot.   Electronically Signed   By: Geanie Cooley M.D.   On: 02/18/2014 15:39   Dg Chest Port 1 View  02/18/2014   CLINICAL DATA:  Right arm PICC line placement. Congestive heart failure.  EXAM: PORTABLE CHEST - 1 VIEW  COMPARISON:  02/15/2014  FINDINGS: Patient is rotated to the left. Low lung volumes noted. A right  arm PICC line is seen with tip overlying the distal SVC.  Cardiomegaly and diffuse interstitial edema pattern show no significant change allowing for technical differences.  IMPRESSION: Right arm PICC line tip overlies the distal SVC.   Electronically Signed   By: Myles Rosenthal M.D.   On: 02/18/2014 12:57   Dg Foot Complete Left  02/17/2014   CLINICAL DATA:  Diabetic foot ulcer.  Left foot pain.  EXAM: LEFT FOOT - COMPLETE 3+ VIEW  COMPARISON:  None.  FINDINGS:  There is no evidence of fracture or dislocation. Mild spurring of posterior calcaneus is noted. No definite lytic destruction is seen to suggest osteomyelitis no radiopaque foreign body is noted. Soft tissues are unremarkable.  IMPRESSION: No acute abnormality seen in the left foot.   Electronically Signed   By: Roque Lias M.D.   On: 02/17/2014 16:09      Assessment/Plan:  Principal Problem:   Sepsis Active Problems:   Atrial fibrillation with tachycardic ventricular rate   Type 2 diabetes, uncontrolled, with neuropathy   Morbid obesity   Cellulitis and abscess of leg   Cor pulmonale, chronic   Pulmonary hypertension   Diastolic CHF with preserved left ventricular function, NYHA class 2   Obstructive chronic bronchitis without exacerbation   Smoker   NSTEMI (non-ST elevated myocardial infarction)   Palliative care encounter   Anxiety state   General weakness   Diabetic foot ulcer   Group G streptococcal infection   Renal failure (ARF), acute on chronic   Severe sepsis with acute organ dysfunction    Joy Benitez is a 69 y.o. female with  Group G streptococcal bacteremia with septic shock, NSTEMI, with source of infection being likely cellulitis + possibly infected DFU, possible underlying osteomyelitis   #1 group G streptococcal bacteremia septic shock in the setting of cellulitis and diabetic foot ulcer:  See prior notes for details  IF SHE SURVIVES THIS HOSPITALIZATION would treat her with 4 weeks of IV Rocephin 2 grams daily  She does not have osteo by CT imaging   #2 DFU: Given  Her deterioration I don't think it is imperative to pursue all the standards of care for diabetic foot ulcers including vascular studies of arterial brachial indices toe brachial indices etc.   #3 Multifactorial respiratory failure:  I do not see much to suggest a direct infectious disease cause of her deterioration other than her shock from group G streptococcal infection. She has  certainly much worse reactive function morbid obesity obesity hypoventilation. She appears to be coming to the end of her life here.  I will step back and defer to palliative care and to Dr. Timothy Lasso  Certainly should she survive and the cable being discharged be happy to follow her up as an outpatient  LOS: 5 days   Joy Benitez 02/19/2014, 11:39 AM

## 2014-02-19 NOTE — Progress Notes (Signed)
Subjective: This am she is more confused and off ABG yesterday c CO2 retention - BIPAP ordered but not used She is wearing mittens as she is picking and pulling at devices etc. Now has PICC line. Had some MSO4 last night  Objective: Vital signs in last 24 hours: Temp:  [96.4 F (35.8 C)-97.5 F (36.4 C)] 97.5 F (36.4 C) (01/07 0400) Pulse Rate:  [25-157] 87 (01/07 0200) Resp:  [0-19] 13 (01/07 0400) BP: (95-112)/(55-76) 109/63 mmHg (01/07 0400) SpO2:  [85 %-100 %] 95 % (01/07 0400) Weight:  [173.274 kg (382 lb)] 173.274 kg (382 lb) (01/07 0029) Weight change: -4.99 kg (-11 lb) Last BM Date: 02/15/14  CBG (last 3)   Recent Labs  02/18/14 1241 02/18/14 1650 02/18/14 2130  GLUCAP 125* 129* 143*    Intake/Output from previous day:  Intake/Output Summary (Last 24 hours) at 02/19/14 0725 Last data filed at 02/18/14 1800  Gross per 24 hour  Intake    446 ml  Output      0 ml  Net    446 ml   01/06 0701 - 01/07 0700 In: 446 [P.O.:300; I.V.:46; IV Piggyback:100] Out: -    Physical Exam  General appearance: Sick appearing. Ill.  Confused.  Not oriented.  Wearing mittens OP - Dry.  Poor Dentition Neck: no adenopathy, no carotid bruit,  Hard to assess JVD Resp: Distant, Diminished all over Cardio: Irreg/rate controlled GI: Obese, non tender. Extremities: 2-3 + Edema, Venosus stasis, cellulitis improving, Excoriations, tender. BPulses: 1- 2+ and symmetric.  R arm PICC in place Lymph nodes: no cervical lymphadenopathy Neurologic: confused and moaning.   Lab Results:  Recent Labs  02/18/14 0229 02/19/14 0410  NA 131* 134*  K 4.3 4.7  CL 101 104  CO2 19 24  GLUCOSE 131* 135*  BUN 48* 44*  CREATININE 1.86* 1.55*  CALCIUM 8.5 8.9     Recent Labs  02/18/14 0229 02/19/14 0410  AST 27 22  ALT 15 14  ALKPHOS 99 98  BILITOT 1.7* 1.7*  PROT 7.0 7.5  ALBUMIN 2.6* 2.6*     Recent Labs  02/18/14 0229 02/19/14 0410  WBC 11.9* 12.5*  HGB 11.0*  10.5*  HCT 35.5* 34.9*  MCV 86.6 84.9  PLT 312 305    Lab Results  Component Value Date   INR 1.70* 02/15/2014   INR 1.74* 04/22/2010   INR 1.59* 04/21/2010    No results for input(s): CKTOTAL, CKMB, CKMBINDEX, TROPONINI in the last 72 hours.  No results for input(s): TSH, T4TOTAL, T3FREE, THYROIDAB in the last 72 hours.  Invalid input(s): FREET3  No results for input(s): VITAMINB12, FOLATE, FERRITIN, TIBC, IRON, RETICCTPCT in the last 72 hours.  Micro Results: Recent Results (from the past 240 hour(s))  Blood culture (routine x 2)     Status: None   Collection Time: 02/17/14  9:51 AM  Result Value Ref Range Status   Specimen Description BLOOD RIGHT ARM  Final   Special Requests BOTTLES DRAWN AEROBIC AND ANAEROBIC 3CC  Final   Culture   Final    STREPTOCOCCUS GROUP G Note: SUSCEPTIBILITIES PERFORMED ON PREVIOUS CULTURE WITHIN THE LAST 5 DAYS. Note: Gram Stain Report Called to,Read Back By and Verified With: Ara Kussmaul RN on 02/15/14 at 06:28 by Christie Nottingham Performed at [facility]    Report Status 02/17/2014 FINAL  Final  Blood culture (routine x 2)     Status: None   Collection Time: 02/17/2014 10:33 AM  Result Value  Ref Range Status   Specimen Description BLOOD LEFT ARM  Final   Special Requests BOTTLES DRAWN AEROBIC AND ANAEROBIC 10CC  Final   Culture   Final    STREPTOCOCCUS GROUP G Note: Gram Stain Report Called to,Read Back By and Verified With: Ara Kussmaul RN on 02/15/14 at 06:28 by Christie Nottingham Performed at [facility]    Report Status 02/17/2014 FINAL  Final   Organism ID, Bacteria STREPTOCOCCUS GROUP G  Final      Susceptibility   Streptococcus group g - MIC (ETEST)*    PENICILLIN 0.016 SENSITIVE Sensitive     * STREPTOCOCCUS GROUP G  Urine culture     Status: None   Collection Time: 02/16/2014 11:09 AM  Result Value Ref Range Status   Specimen Description URINE, CATHETERIZED  Final   Special Requests NONE  Final   Colony Count   Final     >=100,000 COLONIES/ML Performed at [facility]    Culture   Final    PROTEUS MIRABILIS Performed at [facility]    Report Status 02/16/2014 FINAL  Final   Organism ID, Bacteria PROTEUS MIRABILIS  Final      Susceptibility   Proteus mirabilis - MIC*    AMPICILLIN <=2 SENSITIVE Sensitive     CEFAZOLIN <=4 SENSITIVE Sensitive     CEFTRIAXONE <=1 SENSITIVE Sensitive     CIPROFLOXACIN <=0.25 SENSITIVE Sensitive     GENTAMICIN <=1 SENSITIVE Sensitive     LEVOFLOXACIN <=0.12 SENSITIVE Sensitive     NITROFURANTOIN 128 RESISTANT Resistant     TOBRAMYCIN <=1 SENSITIVE Sensitive     TRIMETH/SULFA <=20 SENSITIVE Sensitive     PIP/TAZO <=4 SENSITIVE Sensitive     * PROTEUS MIRABILIS  MRSA PCR Screening     Status: None   Collection Time: 02/25/2014  4:21 PM  Result Value Ref Range Status   MRSA by PCR NEGATIVE NEGATIVE Final    Comment:        The GeneXpert MRSA Assay (FDA approved for NASAL specimens only), is one component of a comprehensive MRSA colonization surveillance program. It is not intended to diagnose MRSA infection nor to guide or monitor treatment for MRSA infections.   Culture, blood (routine x 2)     Status: None (Preliminary result)   Collection Time: 02/15/14 12:10 PM  Result Value Ref Range Status   Specimen Description LEFT ANTECUBITAL  Final   Special Requests BOTTLES DRAWN AEROBIC ONLY 5CC  Final   Culture   Final           BLOOD CULTURE RECEIVED NO GROWTH TO DATE CULTURE WILL BE HELD FOR 5 DAYS BEFORE ISSUING A FINAL NEGATIVE REPORT Performed at [facility]    Report Status PENDING  Incomplete  Culture, blood (routine x 2)     Status: None (Preliminary result)   Collection Time: 02/15/14 12:15 PM  Result Value Ref Range Status   Specimen Description BLOOD LEFT HAND  Final   Special Requests BOTTLES DRAWN AEROBIC ONLY 5CC  Final   Culture   Final           BLOOD CULTURE RECEIVED NO GROWTH TO DATE CULTURE WILL BE HELD FOR 5  DAYS BEFORE ISSUING A FINAL NEGATIVE REPORT Performed at [facility]    Report Status PENDING  Incomplete     Studies/Results: Ct Foot Left Wo Contrast  02/18/2014   CLINICAL DATA:  Diabetic foot ulcer.  Peripheral neuropathy.  EXAM: CT OF THE LEFT  FOOT WITHOUT CONTRAST  TECHNIQUE: Multidetector CT imaging of the left foot was performed according to the standard protocol. Multiplanar CT image reconstructions were also generated.  COMPARISON:  Radiographs dated 02/17/2013 There is a 10 x 14 mm superficial soft tissue ulceration on the dorsum of the foot at the level of the mid first and second metatarsals. There is no appreciable underlying abscess. There is no osteomyelitis. The underlying tendons and muscles appear normal.  The areas extensive motion artifact during the scan but I feel but the study is diagnostic for the area of the ulcer. The hindfoot is not seen due to patient movement during the scan. She was unable to hold still.  FINDINGS: Suboptimal CT scan of the foot with no visualization of the hindfoot. However, there is no evidence of osteomyelitis or abscess of the forefoot. Superficial soft tissue ulceration on the dorsum of the foot.   Electronically Signed   By: Geanie Cooley M.D.   On: 02/18/2014 15:39   Dg Chest Port 1 View  02/18/2014   CLINICAL DATA:  Right arm PICC line placement. Congestive heart failure.  EXAM: PORTABLE CHEST - 1 VIEW  COMPARISON:  02/15/2014  FINDINGS: Patient is rotated to the left. Low lung volumes noted. A right arm PICC line is seen with tip overlying the distal SVC.  Cardiomegaly and diffuse interstitial edema pattern show no significant change allowing for technical differences.  IMPRESSION: Right arm PICC line tip overlies the distal SVC.   Electronically Signed   By: Myles Rosenthal M.D.   On: 02/18/2014 12:57   Dg Foot Complete Left  02/17/2014   CLINICAL DATA:  Diabetic foot ulcer.  Left foot pain.  EXAM: LEFT FOOT - COMPLETE 3+ VIEW  COMPARISON:   None.  FINDINGS: There is no evidence of fracture or dislocation. Mild spurring of posterior calcaneus is noted. No definite lytic destruction is seen to suggest osteomyelitis no radiopaque foreign body is noted. Soft tissues are unremarkable.  IMPRESSION: No acute abnormality seen in the left foot.   Electronically Signed   By: Roque Lias M.D.   On: 02/17/2014 16:09     Medications: Scheduled: . antiseptic oral rinse  7 mL Mouth Rinse BID  . aspirin  81 mg Oral Daily  . atorvastatin  80 mg Oral q1800  . cefTRIAXone (ROCEPHIN)  IV  2 g Intravenous Q24H  . collagenase   Topical Daily  . digoxin  0.0625 mg Oral Daily  . enoxaparin (LOVENOX) injection  30 mg Subcutaneous Q24H  . feeding supplement (GLUCERNA SHAKE)  237 mL Oral TID BM  . furosemide  40 mg Oral Daily  . gabapentin  600 mg Oral BID  . hydrocerin  1 application Topical BID  . insulin aspart  0-15 Units Subcutaneous TID WC  . insulin aspart  0-5 Units Subcutaneous QHS  . insulin glargine  15 Units Subcutaneous QHS  . metoprolol succinate  25 mg Oral Daily  . multivitamin with minerals  1 tablet Oral Daily  . nystatin cream   Topical BID  . senna  1 tablet Oral BID  . sodium chloride  10-40 mL Intracatheter Q12H   Continuous:     Assessment/Plan: Principal Problem:   Sepsis Active Problems:   Atrial fibrillation with tachycardic ventricular rate   Type 2 diabetes, uncontrolled, with neuropathy   Morbid obesity   Cellulitis and abscess of leg   Cor pulmonale, chronic   Pulmonary hypertension   Diastolic CHF with preserved left ventricular function,  NYHA class 2   Obstructive chronic bronchitis without exacerbation   Smoker   NSTEMI (non-ST elevated myocardial infarction)   Palliative care encounter   Anxiety state   General weakness   Diabetic foot ulcer   Group G streptococcal infection   Renal failure (ARF), acute on chronic   Severe sepsis with acute organ dysfunction  SEPSIS =  Cellulitis/Sepsis/AMS/Demand MI/Afib RVR/Hypotension/fever/DeH/AFTT/Very Elevated Lactaic Acid and Leukocytosis. Gram + cocci in chains Bacteremia = group G Strept - TTE was (-) but not conclusive to R/out Endocarditis.  May need TEE? Or just 4 weeks Abx.  Appreciate ID consult. Continue Rocephin Keep SBP @ 90+. No Pressors needed. Proteus UTI Noted S to most Abx Recheck Blood Cx sent and Negative thus far.  Will repeat whenever she has fevers.  She has not had repeat Fevers BPs better and remains off IVF resuscitation at this time.  Foot Xray - No acute abnormality seen in the left foot. CT - no evidence of osteomyelitis or abscess of the forefoot. Superficial soft tissue ulceration on the dorsum of the foot.    S/P PICC.  She will never be a HD candidate.  Cxs (-) to date.  ID aware of PICC placement.  AKI on top of CKD - Cr up to 1.86 - now better at 1.55.  Follow.  Leukocytosis up to 26.6 in ED- down to 15.8 - 13.8 - 12.8 - 11.9 -12.5 currently on Abx.    NSTEMI/Acute Demand MI c peak Trop I 7.63 and elevated PRO-BNP secondary to Sepsis and presumed underlying CAD. Conservative management. May need CATH after Acute issues resolve but c renal issues we would need to be careful. S/P lots of fluid for Sepsis which made her Vol Overload worse. last BNP is 829.  Sats stable on supplemental O2.   LDL only 69 Cont heparin, ASA/Statin; change metoprolol to toprol. EF significantly reduced; no ACEI due to hypotension and renal insuff. Consider myoview vs cath if she improves.  After 5 days of hep gtt we stopped it and changed to lovenox  AFIb RVR - Cardizem IV one - PO cardizem if HR and BP tolerate.  HR currently fine. Continue low dose Dig and toprol. Dig level 0.5. She has refused Coumadin several in the past as she was on it for a little while and come off on her own. She has also refused the newer agents. Cards on board.   CHF - Current EF dropped to 25-30%. LV Cavity size markedly  dilated.  Mod LVH.  AK of Anteroseptal Myocardium.  PAP 51 mm Hg.  ECHO worse post MI  Last ECHO 11/05/13 c/w HFpEF - EF 50-55%-Abnormal septal motion. The cavity size was normal. Wall thickness was increased in a pattern of severe LVH. RA and RV Mod Dilated; PA peak pressure: 43 mm Hg.  Diastolic/R Sided c recurring Anasarca although recent Ab [place] in March 2015 showed Hepatomegaly with suspected hepatic steatosis/Prior cholecystectomy/No ascites. Restart Lasix/Aldactonewhen BP toplerates  Severe Pulmonary HTN/Cor Pulmonale from Obesity Hypoventilation and COPD. She has not wanted formal sleep study and CPA/BiPAP use.  Brady events last night may have been OSA related.  Recheck ABG and see if need BiPAP.  If #s worsening it may indicate that she is dying and we may need to switch to Hospice only.  Morbid obesity - Goal Wt is < 300. She fluctuates up and down 50#s depending on Edema.   Due to chronic illness she also has Prot Cal Malnutrition - Low  Albumin - needs wt loss but to be able to maintain muscle mass. Consult nutrition when some better  COPD/Smoker - Needs to stay quit forever - Provide inhalers/Nebs prn Hypertension - Now Hypotensive due to sepsis. Try to restore BP Anemia - Hbg at 12.3 - 10.7 - 10.3 -  10.9  - 11.0 - 10.5. Will follow Chronic Low back pain/Degenerative disk disease/Gait Instability/Fall Risk/Chronic OA - Walker. Pain meds (they unfortunately lower BP).  High Fall risk.  Will need PT when some better DM2 Neuropathy. - Gabapentin Chronic Venous insufficiency with chronic venous stasis c lower extremity edema.   Fungal Dermatitis - Nystatin  Type II or unspecified type diabetes mellitus with neurological manifestations, uncontrolled- insulin as able- Lantus/Novolog- Hold Metformin c lactic acidosis -   Recent Labs  02/18/14 1241 02/18/14 1650 02/18/14 2130  GLUCAP 125* 129* 143*   ID -  Anti-infectives    Start     Dose/Rate Route Frequency Ordered Stop    02/17/14 1000  cefTRIAXone (ROCEPHIN) 2 g in dextrose 5 % 50 mL IVPB - Premix     2 g100 mL/hr over 30 Minutes Intravenous Every 24 hours 02/17/14 0907     02/15/14 1200  vancomycin (VANCOCIN) 2,500 mg in sodium chloride 0.9 % 500 mL IVPB  Status:  Discontinued     2,500 mg250 mL/hr over 120 Minutes Intravenous Every 24 hours 02/18/2014 1555 02/20/2014 1608   02/15/14 0200  vancomycin (VANCOCIN) 1,250 mg in sodium chloride 0.9 % 250 mL IVPB  Status:  Discontinued     1,250 mg166.7 mL/hr over 90 Minutes Intravenous Every 12 hours 02/28/2014 1412 02/17/14 0907   03/10/2014 2200  piperacillin-tazobactam (ZOSYN) IVPB 3.375 g  Status:  Discontinued     3.375 g12.5 mL/hr over 240 Minutes Intravenous Every 8 hours 03/01/2014 1412 02/17/14 0907   03/11/2014 1800  piperacillin-tazobactam (ZOSYN) IVPB 3.375 g  Status:  Discontinued     3.375 g100 mL/hr over 30 Minutes Intravenous 4 times per day 02/28/2014 1555 02/20/2014 1608   03/04/2014 1145  piperacillin-tazobactam (ZOSYN) IVPB 3.375 g     3.375 g100 mL/hr over 30 Minutes Intravenous  Once 02/24/2014 1130 02/26/2014 1238   02/24/2014 1145  vancomycin (VANCOCIN) 2,500 mg in sodium chloride 0.9 % 500 mL IVPB     2,500 mg250 mL/hr over 120 Minutes Intravenous  Once 03/12/2014 1130 02/25/2014 1405      DNR.  Palliative care consult appreciated and they recommended: 1. Anxiety: Continue Xanax 0.5 mg BID prn. May need something scheduled qhs to help with sleep. I started Trazadone and she actually got worse. 2. Sleep disturbance: Likely related to anxiety.  3. Depression since death of daughter: July 18, 2022 consider antidepressant if needed.  4. Generalized Weakness: She was walking independently in her home with a walker PTA. Consider PT. Continue medical management.She does have a poor prognosis.  She is not mentally appropriate for PT at this time  Continue Stepdown, IV Abx, Lasix as able (aldactone held), Cardizem prn, low dose BB c parameters, and continue diet. Currently off IVF  and BP better. Continue Dig Low dose. Remains confused.  Ammonia High and will start Lactulose if she is awake enough for it.  Recheck ABG as she was retaining yesterday and may need BiPAP.  S/P PICC.  Off Hep gtt and on DVT dosed Lovenox. Lasix 10 mg PO daily Hold sbp < 105.  Unclear if she will make it or not.  If gets sicker may need to transition to hospice  only.    LOS: 5 days   Setsuko Robins M 02/19/2014, 7:25 AM

## 2014-02-19 NOTE — Progress Notes (Signed)
Progress Note from the Palliative Medicine Team at [facility]  Subjective: [name] appears cyanotic and is somnolent this morning. She is declining greatly. I had the opportunity to speak with Mr. Arreaga and I was very frank in telling him I believe that we are nearing the end soon. He was very understanding and agrees to comfort medications as she needs but wants to continue antibiotics/BiPAP as indicated. He understands these measures may not help her at this point. He tells me that she has been ready to die for long time now. He is worried about their son and I offered to speak with him if he desires. I was unable to speak further with Mr. Pavone as he was anxious to get off the phone to call other family members.    Objective: Allergies  Allergen Reactions  . Levaquin [Levofloxacin In D5w] Itching   Scheduled Meds: . antiseptic oral rinse  7 mL Mouth Rinse BID  . aspirin  81 mg Oral Daily  . atorvastatin  80 mg Oral q1800  . cefTRIAXone (ROCEPHIN)  IV  2 g Intravenous Q24H  . collagenase   Topical Daily  . digoxin  0.0625 mg Oral Daily  . enoxaparin (LOVENOX) injection  30 mg Subcutaneous Q24H  . feeding supplement (GLUCERNA SHAKE)  237 mL Oral TID BM  . furosemide  40 mg Intravenous BID  . gabapentin  600 mg Oral BID  . hydrocerin  1 application Topical BID  . insulin aspart  0-15 Units Subcutaneous TID WC  . insulin aspart  0-5 Units Subcutaneous QHS  . insulin glargine  10 Units Subcutaneous QHS  . lactulose  10 g Oral Daily  . metoprolol succinate  25 mg Oral Daily  . multivitamin with minerals  1 tablet Oral Daily  . nystatin cream   Topical BID  . senna  1 tablet Oral BID  . sodium chloride  10-40 mL Intracatheter Q12H   Continuous Infusions:  PRN Meds:.acetaminophen **OR** acetaminophen, albuterol, ALPRAZolam, HYDROcodone-acetaminophen, morphine injection, sodium chloride  BP 112/68 mmHg  Pulse 69  Temp(Src) 97.4 F (36.3 C) (Axillary)  Resp 11  Ht   (1.651 m)  Wt 173.274 kg (382 lb)  BMI 63.57 kg/m2  SpO2 96%   PPS: 10%   Intake/Output Summary (Last 24 hours) at 02/19/14 1418 Last data filed at 02/19/14 0935  Gross per 24 hour  Intake    290 ml  Output      0 ml  Net    290 ml      Physical Exam:  General: Pale, lips cyanotic, obese, appears acutely ill and fatigued HEENT: Helix/AT, poor dentition, dry mucous membranes Chest: Diminished throughout CVS: Irreg, rate controlled Abdomen: Soft, NT, ND, +BS Ext: MAE, BLE 2+ edema/erythema with improvement Neuro: Confused, moaning "Oh Lord, help me"  Labs: CBC    Component Value Date/Time   WBC 12.5* 02/19/2014 0410   RBC 4.11 02/19/2014 0410   RBC 4.05 11/04/2013 2304   HGB 10.5* 02/19/2014 0410   HCT 34.9* 02/19/2014 0410   PLT 305 02/19/2014 0410   MCV 84.9 02/19/2014 0410   MCH 25.5* 02/19/2014 0410   MCHC 30.1 02/19/2014 0410   RDW 19.2* 02/19/2014 0410   LYMPHSABS 1.9 2014/03/08 0951   MONOABS 2.1* March 08, 2014 0951   EOSABS 0.0 03-08-2014 0951   BASOSABS 0.0 08-Mar-2014 0951    BMET    Component Value Date/Time   NA 134* 02/19/2014 0410   K 4.7 02/19/2014 0410  CL 104 02/19/2014 0410   CO2 24 02/19/2014 0410   GLUCOSE 135* 02/19/2014 0410   BUN 44* 02/19/2014 0410   CREATININE 1.55* 02/19/2014 0410   CALCIUM 8.9 02/19/2014 0410   GFRNONAA 34* 02/19/2014 0410   GFRAA 39* 02/19/2014 0410    CMP     Component Value Date/Time   NA 134* 02/19/2014 0410   K 4.7 02/19/2014 0410   CL 104 02/19/2014 0410   CO2 24 02/19/2014 0410   GLUCOSE 135* 02/19/2014 0410   BUN 44* 02/19/2014 0410   CREATININE 1.55* 02/19/2014 0410   CALCIUM 8.9 02/19/2014 0410   PROT 7.5 02/19/2014 0410   ALBUMIN 2.6* 02/19/2014 0410   AST 22 02/19/2014 0410   ALT 14 02/19/2014 0410   ALKPHOS 98 02/19/2014 0410   BILITOT 1.7* 02/19/2014 0410   GFRNONAA 34* 02/19/2014 0410   GFRAA 39* 02/19/2014 0410    Assessment and Plan: 1. Code Status: DNR 2. Symptom  Control: 1. Pain/dyspnea: Morphine 1 mg every 6 hours prn. May consider increasing frequency if needed for comfort. 2. Anxiety: Changed Xanax to Ativan IV as she is unable to tolerate po currently. Ativan 0.5 TID prn.  3. Psycho/Social: Emotional support provided to patient and to family over the phone.  4. Spiritual: Chaplain consulted to comfort patient.  5. Disposition: To be determined.     Time In Time Out Total Time Spent with Patient Total Overall Time  1350 1420     Greater than 50%  of this time was spent counseling and coordinating care related to the above assessment and plan.  Yong Channel, NP Palliative Medicine Team Pager # [phone] (M-F 8a-5p) Team Phone # [phone] (Nights/Weekends)

## 2014-02-20 ENCOUNTER — Inpatient Hospital Stay (HOSPITAL_COMMUNITY): Payer: Medicare Other

## 2014-02-20 DIAGNOSIS — R6521 Severe sepsis with septic shock: Secondary | ICD-10-CM

## 2014-02-20 DIAGNOSIS — Z515 Encounter for palliative care: Secondary | ICD-10-CM

## 2014-02-20 DIAGNOSIS — I5030 Unspecified diastolic (congestive) heart failure: Secondary | ICD-10-CM

## 2014-02-20 LAB — BLOOD GAS, ARTERIAL
Acid-base deficit: 3.6 mmol/L — ABNORMAL HIGH (ref 0.0–2.0)
Bicarbonate: 21.2 mEq/L (ref 20.0–24.0)
Delivery systems: POSITIVE
Drawn by: 34767
Expiratory PAP: 5
Inspiratory PAP: 10
O2 Content: 3 L/min
O2 Saturation: 89.4 %
Patient temperature: 98.6
TCO2: 22.4 mmol/L (ref 0–100)
pCO2 arterial: 39.8 mmHg (ref 35.0–45.0)
pH, Arterial: 7.346 — ABNORMAL LOW (ref 7.350–7.450)
pO2, Arterial: 63 mmHg — ABNORMAL LOW (ref 80.0–100.0)

## 2014-02-20 LAB — CBC
HCT: 34 % — ABNORMAL LOW (ref 36.0–46.0)
Hemoglobin: 10.1 g/dL — ABNORMAL LOW (ref 12.0–15.0)
MCH: 25.3 pg — ABNORMAL LOW (ref 26.0–34.0)
MCHC: 29.7 g/dL — ABNORMAL LOW (ref 30.0–36.0)
MCV: 85.2 fL (ref 78.0–100.0)
Platelets: 288 10*3/uL (ref 150–400)
RBC: 3.99 MIL/uL (ref 3.87–5.11)
RDW: 19.2 % — ABNORMAL HIGH (ref 11.5–15.5)
WBC: 11.7 10*3/uL — ABNORMAL HIGH (ref 4.0–10.5)

## 2014-02-20 LAB — COMPREHENSIVE METABOLIC PANEL
ALBUMIN: 2.5 g/dL — AB (ref 3.5–5.2)
ALT: 11 U/L (ref 0–35)
AST: 20 U/L (ref 0–37)
Alkaline Phosphatase: 92 U/L (ref 39–117)
Anion gap: 6 (ref 5–15)
BILIRUBIN TOTAL: 1.2 mg/dL (ref 0.3–1.2)
BUN: 42 mg/dL — ABNORMAL HIGH (ref 6–23)
CALCIUM: 8.7 mg/dL (ref 8.4–10.5)
CHLORIDE: 104 meq/L (ref 96–112)
CO2: 24 mmol/L (ref 19–32)
Creatinine, Ser: 1.34 mg/dL — ABNORMAL HIGH (ref 0.50–1.10)
GFR calc Af Amer: 47 mL/min — ABNORMAL LOW (ref >90)
GFR calc non Af Amer: 40 mL/min — ABNORMAL LOW (ref >90)
Glucose, Bld: 119 mg/dL — ABNORMAL HIGH (ref 70–99)
Potassium: 4.4 mmol/L (ref 3.5–5.1)
SODIUM: 134 mmol/L — AB (ref 135–145)
Total Protein: 7.1 g/dL (ref 6.0–8.3)

## 2014-02-20 LAB — GLUCOSE, CAPILLARY
Glucose-Capillary: 116 mg/dL — ABNORMAL HIGH (ref 70–99)
Glucose-Capillary: 118 mg/dL — ABNORMAL HIGH (ref 70–99)
Glucose-Capillary: 98 mg/dL (ref 70–99)

## 2014-02-20 MED ORDER — MORPHINE SULFATE 2 MG/ML IJ SOLN
1.0000 mg | INTRAMUSCULAR | Status: DC | PRN
  Administered 2014-02-20: 1 mg via INTRAVENOUS
  Filled 2014-02-20: qty 1

## 2014-02-20 MED ORDER — LORAZEPAM 2 MG/ML IJ SOLN
INTRAMUSCULAR | Status: AC
  Administered 2014-02-20: 1 mg via INTRAVENOUS
  Filled 2014-02-20: qty 1

## 2014-02-20 MED ORDER — LORAZEPAM 2 MG/ML IJ SOLN
0.5000 mg | INTRAMUSCULAR | Status: DC | PRN
  Administered 2014-02-20 – 2014-02-22 (×6): 1 mg via INTRAVENOUS
  Filled 2014-02-20 (×5): qty 1

## 2014-02-20 MED ORDER — MORPHINE SULFATE 2 MG/ML IJ SOLN
1.0000 mg | INTRAMUSCULAR | Status: DC | PRN
  Administered 2014-02-20 – 2014-02-21 (×4): 2 mg via INTRAVENOUS
  Filled 2014-02-20 (×4): qty 1

## 2014-02-20 MED ORDER — LORAZEPAM 2 MG/ML IJ SOLN
0.5000 mg | INTRAMUSCULAR | Status: DC | PRN
  Administered 2014-02-20: 0.5 mg via INTRAVENOUS
  Filled 2014-02-20: qty 1

## 2014-02-20 NOTE — Progress Notes (Signed)
Subjective: She said she was not in pain when asked.  Objective: Vital signs in last 24 hours: Temp:  [97.4 F (36.3 C)-98.6 F (37 C)] 98.6 F (37 C) (01/08 0800) Pulse Rate:  [64-89] 64 (01/08 0800) Resp:  [11-18] 18 (01/08 0800) BP: (96-115)/(50-78) 106/52 mmHg (01/08 0800) SpO2:  [94 %-100 %] 94 % (01/08 1030) Last BM Date: 02/15/14  Intake/Output from previous day: 01/07 0701 - 01/08 0700 In: 50 [IV Piggyback:50] Out: 1350 [Urine:1350] Intake/Output this shift: Total I/O In: 20 [I.V.:20] Out: -   Medications Current Facility-Administered Medications  Medication Dose Route Frequency Provider Last Rate Last Dose  . acetaminophen (TYLENOL) tablet 650 mg  650 mg Oral Q6H PRN Gwen Pounds, MD       Or  . acetaminophen (TYLENOL) suppository 650 mg  650 mg Rectal Q6H PRN Gwen Pounds, MD      . albuterol (PROVENTIL) (2.5 MG/3ML) 0.083% nebulizer solution 3 mL  3 mL Inhalation Q6H PRN Gwen Pounds, MD      . antiseptic oral rinse (CPC / CETYLPYRIDINIUM CHLORIDE 0.05%) solution 7 mL  7 mL Mouth Rinse BID Gwen Pounds, MD   7 mL at 02/19/14 2138  . aspirin chewable tablet 81 mg  81 mg Oral Daily Ok Anis, NP   81 mg at 02/18/14 0630  . atorvastatin (LIPITOR) tablet 80 mg  80 mg Oral q1800 Ok Anis, NP   80 mg at 02/18/14 1851  . cefTRIAXone (ROCEPHIN) 2 g in dextrose 5 % 50 mL IVPB - Premix  2 g Intravenous Q24H Randall Hiss, MD   2 g at 02/19/14 0935  . collagenase (SANTYL) ointment   Topical Daily Gwen Pounds, MD      . digoxin [facility]) tablet 0.0625 mg  0.0625 mg Oral Daily Gwen Pounds, MD   0.0625 mg at 02/18/14 0919  . enoxaparin (LOVENOX) injection 30 mg  30 mg Subcutaneous Q24H Gwen Pounds, MD   30 mg at 02/20/14 0900  . feeding supplement (GLUCERNA SHAKE) (GLUCERNA SHAKE) liquid 237 mL  237 mL Oral TID BM Hettie Holstein, RD   237 mL at 02/18/14 2000  . furosemide (LASIX) injection 40 mg  40 mg Intravenous BID Lewayne Bunting,  MD   40 mg at 02/20/14 0900  . gabapentin (NEURONTIN) capsule 600 mg  600 mg Oral BID Gwen Pounds, MD   600 mg at 02/18/14 2232  . hydrocerin (EUCERIN) cream 1 application  1 application Topical BID Gwen Pounds, MD   1 application at 02/19/14 2138  . HYDROcodone-acetaminophen (NORCO/VICODIN) 5-325 MG per tablet 1 tablet  1 tablet Oral Q8H PRN Gwen Pounds, MD   1 tablet at 02/17/14 0252  . insulin aspart (novoLOG) injection 0-15 Units  0-15 Units Subcutaneous TID WC Gwen Pounds, MD   2 Units at 02/17/14 1600  . insulin aspart (novoLOG) injection 0-5 Units  0-5 Units Subcutaneous QHS Gwen Pounds, MD   2 Units at 03/06/2014 2238  . insulin glargine (LANTUS) injection 10 Units  10 Units Subcutaneous QHS Gwen Pounds, MD   10 Units at 02/19/14 2306  . lactulose (CHRONULAC) 10 GM/15ML solution 10 g  10 g Oral Daily Gwen Pounds, MD   10 g at 02/19/14 1119  . LORazepam (ATIVAN) injection 0.5 mg  0.5 mg Intravenous Q6H PRN Ulice Bold, NP   0.5 mg at 02/20/14 0442  .  metoprolol succinate (TOPROL-XL) 24 hr tablet 25 mg  25 mg Oral Daily Lewayne Bunting, MD   25 mg at 02/18/14 1610  . morphine 2 MG/ML injection 1 mg  1 mg Intravenous Q6H PRN Gwen Pounds, MD   1 mg at 02/20/14 0802  . multivitamin with minerals tablet 1 tablet  1 tablet Oral Daily Hettie Holstein, RD   1 tablet at 02/18/14 1242  . nystatin cream (MYCOSTATIN)   Topical BID Gwen Pounds, MD      . Gwyndolyn Kaufman [facility]) tablet 8.6 mg  1 tablet Oral BID Gwen Pounds, MD   8.6 mg at 02/18/14 2232  . sodium chloride 0.9 % injection 10-40 mL  10-40 mL Intracatheter Q12H Gwen Pounds, MD   20 mL at 02/20/14 0909  . sodium chloride 0.9 % injection 10-40 mL  10-40 mL Intracatheter PRN Gwen Pounds, MD   10 mL at 02/19/14 1121    PE: General appearance: uncooperative and Agitated Lungs: Decreased BS, rhonchi Heart: irregularly irregular rhythm and No MRG Abdomen: +BS.   Extremities: Trace LEE Pulses: 2+ and symmetric Skin: Diffuse  erythema in the LE Neurologic: Awake, Agitated, uncooperative   Lab Results:   Recent Labs  02/18/14 0229 02/19/14 0410 02/20/14 0444  WBC 11.9* 12.5* 11.7*  HGB 11.0* 10.5* 10.1*  HCT 35.5* 34.9* 34.0*  PLT 312 305 288   BMET  Recent Labs  02/18/14 0229 02/19/14 0410 02/20/14 0444  NA 131* 134* 134*  K 4.3 4.7 4.4  CL 101 104 104  CO2 GLUCOSE 131* 135* 119*  BUN 48* 44* 42*  CREATININE 1.86* 1.55* 1.34*  CALCIUM 8.5 8.9 8.7     Assessment/Plan   1. Sepsis with lower extremity cellulitis - group G strep bacteremia, ID following, continue antibiotics  -WBC mildly elevated  According to Dr. Ferd Hibbs note they are moving toward palliative and hospice care.  She is not taking PO meds.  None in two days    2. NSTEMI: plan for conservative management for now. Not taking PO meds.   - Echo 11/05/2013 EF 50-55% - Echo 02/16/2014 EF 25-30%, akinesis of anteroseptal myocardium, moderatedly dilated LA, moderate TR, PA peak  pressure 51 mmHg.  -   3. A-fib:  Rate mildly elevated.  She is aggitated - she refused OAC in the past.  -  Continue to monitor rate.  Use IV lopressor if RVR and BP will allow.  Currently it is soft.  4. AKI; renal function improving  5. COPD/Tobacco abuse/Pulm HTN  6. DM  7. Morbid obesity  8. Acute systolic CHF:   Net fluids: -1.3L/+6.7L.  Still volume overload.  Edema distributed up her back.  Getting lasix 40 mg IV BID    LOS: 6 days    HAGER, BRYAN PA-C 02/20/2014 11:34 AM  As above; patient does not respond appropriately to questions. Plans for hospice/comfort noted. Can continue/increase lasix as needed for volume excess. We will sign off; please call with questions. Olga Millers

## 2014-02-20 NOTE — Progress Notes (Signed)
Progress Note from the Palliative Medicine Team at [facility]  Ms. Lwin continues to decline. I have adjusted her medication for comfort:  1) Morphine 1 mg IV every 3 hours prn pain/dyspnea 2) Lorazepam 0.5 mg every 3 hours prn anxiety  Unfortunately I am unable to reach her husband for update as I believe she will continue to decline and may pass at any time now. I did make this clear to him yesterday and he did agree to giving her whatever medication she needed for comfort.  Yong Channel, NP Palliative Medicine Team Pager # [phone] (M-F 8a-5p) Team Phone # [phone] (Nights/Weekends)

## 2014-02-20 NOTE — Progress Notes (Signed)
Subjective: She is clearly worse. Wearing BiPAP Appears to be actively dying  Objective: Vital signs in last 24 hours: Temp:  [97.4 F (36.3 C)-97.9 F (36.6 C)] 97.9 F (36.6 C) (01/08 0400) Pulse Rate:  [66-101] 88 (01/08 0400) Resp:  [10-17] 11 (01/08 0400) BP: (96-119)/(50-78) 102/50 mmHg (01/08 0400) SpO2:  [94 %-100 %] 96 % (01/08 0400) Weight change:  Last BM Date: 02/15/14  CBG (last 3)   Recent Labs  02/19/14 1224 02/19/14 1545 02/19/14 2238  GLUCAP 129* 114* 116*    Intake/Output from previous day:  Intake/Output Summary (Last 24 hours) at 02/20/14 0650 Last data filed at 02/20/14 0500  Gross per 24 hour  Intake     50 ml  Output   1350 ml  Net  -1300 ml   01/07 0701 - 01/08 0700 In: 50 [IV Piggyback:50] Out: 1350 [Urine:1350]   Physical Exam  General appearance: Sick appearing. Ill.  Out of it.  Not responding to commands  OP - Dry.  Poor Dentition Neck: no adenopathy, no carotid bruit,  Hard to assess JVD Resp: Distant, Diminished all over, RHONCHI now Cardio: Irreg/rate controlled GI: Obese, non tender. Extremities: 2-3 + Edema, Venosus stasis, cellulitis improving, Excoriations, tender. BPulses: 1- 2+ and symmetric.  R arm PICC in place Lymph nodes: no cervical lymphadenopathy Neurologic: Out of it   Lab Results:  Recent Labs  02/19/14 0410 02/20/14 0444  NA 134* 134*  K 4.7 4.4  CL 104 104  CO2 24 24  GLUCOSE 135* 119*  BUN 44* 42*  CREATININE 1.55* 1.34*  CALCIUM 8.9 8.7     Recent Labs  02/19/14 0410 02/20/14 0444  AST 22 20  ALT 14 11  ALKPHOS 98 92  BILITOT 1.7* 1.2  PROT 7.5 7.1  ALBUMIN 2.6* 2.5*     Recent Labs  02/19/14 0410 02/20/14 0444  WBC 12.5* 11.7*  HGB 10.5* 10.1*  HCT 34.9* 34.0*  MCV 84.9 85.2  PLT 305 288    Lab Results  Component Value Date   INR 1.70* 02/15/2014   INR 1.74* 04/22/2010   INR 1.59* 04/21/2010    No results for input(s): CKTOTAL, CKMB, CKMBINDEX, TROPONINI in  the last 72 hours.  No results for input(s): TSH, T4TOTAL, T3FREE, THYROIDAB in the last 72 hours.  Invalid input(s): FREET3  No results for input(s): VITAMINB12, FOLATE, FERRITIN, TIBC, IRON, RETICCTPCT in the last 72 hours.  Micro Results: Recent Results (from the past 240 hour(s))  Blood culture (routine x 2)     Status: None   Collection Time: 02/22/2014  9:51 AM  Result Value Ref Range Status   Specimen Description BLOOD RIGHT ARM  Final   Special Requests BOTTLES DRAWN AEROBIC AND ANAEROBIC 3CC  Final   Culture   Final    STREPTOCOCCUS GROUP G Note: SUSCEPTIBILITIES PERFORMED ON PREVIOUS CULTURE WITHIN THE LAST 5 DAYS. Note: Gram Stain Report Called to,Read Back By and Verified With: Ara Kussmaul RN on 02/15/14 at 06:28 by Christie Nottingham Performed at [facility]    Report Status 02/17/2014 FINAL  Final  Blood culture (routine x 2)     Status: None   Collection Time: 02/15/2014 10:33 AM  Result Value Ref Range Status   Specimen Description BLOOD LEFT ARM  Final   Special Requests BOTTLES DRAWN AEROBIC AND ANAEROBIC 10CC  Final   Culture   Final    STREPTOCOCCUS GROUP G Note: Gram Stain Report Called to,Read Back By and Verified With:  Ara Kussmaul RN on 02/15/14 at 06:28 by Christie Nottingham Performed at [facility]    Report Status 02/17/2014 FINAL  Final   Organism ID, Bacteria STREPTOCOCCUS GROUP G  Final      Susceptibility   Streptococcus group g - MIC (ETEST)*    PENICILLIN 0.016 SENSITIVE Sensitive     * STREPTOCOCCUS GROUP G  Urine culture     Status: None   Collection Time: 03-12-14 11:09 AM  Result Value Ref Range Status   Specimen Description URINE, CATHETERIZED  Final   Special Requests NONE  Final   Colony Count   Final    >=100,000 COLONIES/ML Performed at [facility]    Culture   Final    PROTEUS MIRABILIS Performed at [facility]    Report Status 02/16/2014 FINAL  Final   Organism ID, Bacteria PROTEUS MIRABILIS  Final       Susceptibility   Proteus mirabilis - MIC*    AMPICILLIN <=2 SENSITIVE Sensitive     CEFAZOLIN <=4 SENSITIVE Sensitive     CEFTRIAXONE <=1 SENSITIVE Sensitive     CIPROFLOXACIN <=0.25 SENSITIVE Sensitive     GENTAMICIN <=1 SENSITIVE Sensitive     LEVOFLOXACIN <=0.12 SENSITIVE Sensitive     NITROFURANTOIN 128 RESISTANT Resistant     TOBRAMYCIN <=1 SENSITIVE Sensitive     TRIMETH/SULFA <=20 SENSITIVE Sensitive     PIP/TAZO <=4 SENSITIVE Sensitive     * PROTEUS MIRABILIS  MRSA PCR Screening     Status: None   Collection Time: March 12, 2014  4:21 PM  Result Value Ref Range Status   MRSA by PCR NEGATIVE NEGATIVE Final    Comment:        The GeneXpert MRSA Assay (FDA approved for NASAL specimens only), is one component of a comprehensive MRSA colonization surveillance program. It is not intended to diagnose MRSA infection nor to guide or monitor treatment for MRSA infections.   Culture, blood (routine x 2)     Status: None (Preliminary result)   Collection Time: 02/15/14 12:10 PM  Result Value Ref Range Status   Specimen Description LEFT ANTECUBITAL  Final   Special Requests BOTTLES DRAWN AEROBIC ONLY 5CC  Final   Culture   Final           BLOOD CULTURE RECEIVED NO GROWTH TO DATE CULTURE WILL BE HELD FOR 5 DAYS BEFORE ISSUING A FINAL NEGATIVE REPORT Performed at [facility]    Report Status PENDING  Incomplete  Culture, blood (routine x 2)     Status: None (Preliminary result)   Collection Time: 02/15/14 12:15 PM  Result Value Ref Range Status   Specimen Description BLOOD LEFT HAND  Final   Special Requests BOTTLES DRAWN AEROBIC ONLY 5CC  Final   Culture   Final           BLOOD CULTURE RECEIVED NO GROWTH TO DATE CULTURE WILL BE HELD FOR 5 DAYS BEFORE ISSUING A FINAL NEGATIVE REPORT Performed at [facility]    Report Status PENDING  Incomplete     Studies/Results: Ct Foot Left Wo Contrast  02/18/2014   CLINICAL DATA:  Diabetic foot ulcer.  Peripheral  neuropathy.  EXAM: CT OF THE LEFT FOOT WITHOUT CONTRAST  TECHNIQUE: Multidetector CT imaging of the left foot was performed according to the standard protocol. Multiplanar CT image reconstructions were also generated.  COMPARISON:  Radiographs dated 02/17/2013 There is a 10 x 14 mm superficial soft tissue ulceration on the dorsum of  the foot at the level of the mid first and second metatarsals. There is no appreciable underlying abscess. There is no osteomyelitis. The underlying tendons and muscles appear normal.  The areas extensive motion artifact during the scan but I feel but the study is diagnostic for the area of the ulcer. The hindfoot is not seen due to patient movement during the scan. She was unable to hold still.  FINDINGS: Suboptimal CT scan of the foot with no visualization of the hindfoot. However, there is no evidence of osteomyelitis or abscess of the forefoot. Superficial soft tissue ulceration on the dorsum of the foot.   Electronically Signed   By: Geanie Cooley M.D.   On: 02/18/2014 15:39   Dg Chest Port 1 View  02/18/2014   CLINICAL DATA:  Right arm PICC line placement. Congestive heart failure.  EXAM: PORTABLE CHEST - 1 VIEW  COMPARISON:  02/15/2014  FINDINGS: Patient is rotated to the left. Low lung volumes noted. A right arm PICC line is seen with tip overlying the distal SVC.  Cardiomegaly and diffuse interstitial edema pattern show no significant change allowing for technical differences.  IMPRESSION: Right arm PICC line tip overlies the distal SVC.   Electronically Signed   By: Myles Rosenthal M.D.   On: 02/18/2014 12:57     Medications: Scheduled: . antiseptic oral rinse  7 mL Mouth Rinse BID  . aspirin  81 mg Oral Daily  . atorvastatin  80 mg Oral q1800  . cefTRIAXone (ROCEPHIN)  IV  2 g Intravenous Q24H  . collagenase   Topical Daily  . digoxin  0.0625 mg Oral Daily  . enoxaparin (LOVENOX) injection  30 mg Subcutaneous Q24H  . feeding supplement (GLUCERNA SHAKE)  237 mL Oral  TID BM  . furosemide  40 mg Intravenous BID  . gabapentin  600 mg Oral BID  . hydrocerin  1 application Topical BID  . insulin aspart  0-15 Units Subcutaneous TID WC  . insulin aspart  0-5 Units Subcutaneous QHS  . insulin glargine  10 Units Subcutaneous QHS  . lactulose  10 g Oral Daily  . metoprolol succinate  25 mg Oral Daily  . multivitamin with minerals  1 tablet Oral Daily  . nystatin cream   Topical BID  . senna  1 tablet Oral BID  . sodium chloride  10-40 mL Intracatheter Q12H   Continuous:     Assessment/Plan: Principal Problem:   Sepsis Active Problems:   Atrial fibrillation with tachycardic ventricular rate   Type 2 diabetes, uncontrolled, with neuropathy   Morbid obesity   Cellulitis and abscess of leg   Cor pulmonale, chronic   Pulmonary hypertension   Diastolic CHF with preserved left ventricular function, NYHA class 2   Obstructive chronic bronchitis without exacerbation   Smoker   NSTEMI (non-ST elevated myocardial infarction)   Palliative care encounter   Anxiety state   General weakness   Diabetic foot ulcer   Group G streptococcal infection   Renal failure (ARF), acute on chronic   Severe sepsis with acute organ dysfunction   Cellulitis  SEPSIS =  group G Strept Bacteremia - TTE was (-) but not conclusive to R/out Endocarditis.  On Rocephin No Pressors.  DNR Proteus UTI Noted S to most Abx Recheck Blood Cx sent and Negative thus far.   Foot Xray - No acute abnormality seen in the left foot. CT - no evidence of osteomyelitis or abscess of the forefoot. Superficial soft tissue ulceration on the  dorsum of the foot.   S/P PICC.  She will never be a HD candidate.  Cxs (-) to date.    AKI on top of CKD - Cr up to 1.86 - now better at 1.34.  Following.  Leukocytosis up to 26.6 in ED- down to 15.8 - 13.8 - 12.8 - 11.9 -12.5 - 11.7 currently on Abx.    NSTEMI/Acute Demand MI c peak Trop I 7.63  - Conservative management.   AFIb - Rate  controlled  CHF - c Current EF  25-30% and PAP 51 mm Hg. On IV Lasix as BP tolerates  Severe Pulmonary HTN/Cor Pulmonale from Obesity Hypoventilation and COPD. Getting more resp Acidosis and on BiPAP. Progressing towards Hospice only.  Anemia - Hbg at 12.3 - 10.7 - 10.3 -  10.9  - 11.0 - 10.5 - 10.1. Will follow  Type II diabetes mellitus - Lantus/Novolog- Hold Metformin c lactic acidosis -   Recent Labs  02/19/14 1224 02/19/14 1545 02/19/14 2238  GLUCAP 129* 114* 116*    She is getting worse daily.  She is now obtunded.  Continue DNR.  Appreciate Palliative care help. Will get repeat ABG and CXR and if worse despite current treatments I will attempt to withdraw and do only hospice palliative care.  She is now on Lasix 40 IV BID.  She is not eating and not taking oral meds any longer. She is actively dying and I appreciate Palliative Cares help.  She remains on BiPAP and IV Abx.  Again if parameters are worsening despite best efforts we will need to withdraw.  For now continue Stepdown, IV Abx, Lasix . Currently off IVF and BPOK. S/P PICC.  Off Hep gtt and on DVT dosed Lovenox. MSO4 and Ativan for comfort on order.  I called and left messages c Windy Fast her husband [phone] that things have progressed and are doing terrible.   LOS: 6 days   Nejla Reasor M 02/20/2014, 6:50 AM

## 2014-02-21 LAB — COMPREHENSIVE METABOLIC PANEL
ALBUMIN: 2.7 g/dL — AB (ref 3.5–5.2)
ALK PHOS: 94 U/L (ref 39–117)
ALT: 11 U/L (ref 0–35)
AST: 20 U/L (ref 0–37)
Anion gap: 10 (ref 5–15)
BILIRUBIN TOTAL: 1.4 mg/dL — AB (ref 0.3–1.2)
BUN: 37 mg/dL — ABNORMAL HIGH (ref 6–23)
CO2: 24 mmol/L (ref 19–32)
Calcium: 8.6 mg/dL (ref 8.4–10.5)
Chloride: 103 mEq/L (ref 96–112)
Creatinine, Ser: 1.23 mg/dL — ABNORMAL HIGH (ref 0.50–1.10)
GFR calc Af Amer: 52 mL/min — ABNORMAL LOW (ref >90)
GFR calc non Af Amer: 45 mL/min — ABNORMAL LOW (ref >90)
GLUCOSE: 105 mg/dL — AB (ref 70–99)
POTASSIUM: 4.2 mmol/L (ref 3.5–5.1)
SODIUM: 137 mmol/L (ref 135–145)
Total Protein: 7 g/dL (ref 6.0–8.3)

## 2014-02-21 LAB — CULTURE, BLOOD (ROUTINE X 2)
CULTURE: NO GROWTH
Culture: NO GROWTH

## 2014-02-21 LAB — CBC
HCT: 35.5 % — ABNORMAL LOW (ref 36.0–46.0)
Hemoglobin: 10.4 g/dL — ABNORMAL LOW (ref 12.0–15.0)
MCH: 25.4 pg — ABNORMAL LOW (ref 26.0–34.0)
MCHC: 29.3 g/dL — ABNORMAL LOW (ref 30.0–36.0)
MCV: 86.8 fL (ref 78.0–100.0)
PLATELETS: 279 10*3/uL (ref 150–400)
RBC: 4.09 MIL/uL (ref 3.87–5.11)
RDW: 19.2 % — ABNORMAL HIGH (ref 11.5–15.5)
WBC: 11.3 10*3/uL — ABNORMAL HIGH (ref 4.0–10.5)

## 2014-02-21 MED ORDER — HALOPERIDOL LACTATE 5 MG/ML IJ SOLN: 2.0000 mg | Freq: Four times a day (QID) | INTRAMUSCULAR | Status: DC | PRN

## 2014-02-21 MED ORDER — MORPHINE SULFATE 25 MG/ML IV SOLN
1.0000 mg/h | INTRAVENOUS | Status: DC
  Administered 2014-02-21: 1 mg/h via INTRAVENOUS
  Filled 2014-02-21: qty 10

## 2014-02-21 NOTE — Progress Notes (Signed)
Subjective: RN reports increased agitation yesterday and overnight.  Would not tolerate BiPap- more comfortable after MSO4 and discontinuation of bipap.  Has not taken any pos or meds in 3 days.  BP 60s/40s.  Objective: Vital signs in last 24 hours: Temp:  [97.6 F (36.4 C)-98.8 F (37.1 C)] 97.9 F (36.6 C) (01/09 0354) Pulse Rate:  [64-101] 99 (01/09 0553) Resp:  [12-21] 21 (01/09 0553) BP: (95-110)/(48-75) 110/75 mmHg (01/09 0553) SpO2:  [94 %-99 %] 99 % (01/09 0553) Weight change:  Last BM Date: 02/15/14  CBG (last 3)   Recent Labs  02/19/14 2238 02/20/14 0739 02/20/14 2334  GLUCAP 116* 118* 98    Intake/Output from previous day: 01/08 0701 - 01/09 0700 In: 30 [I.V.:30] Out: 1950 [Urine:1950] Intake/Output this shift:    General appearance: unresponsive, Shallow breathing pattern Eyes: no scleral icterus Throat: oropharynx moist without erythema Resp: decreased breath sounds Cardio: regular rate and rhythm GI: soft, non-tender; bowel sounds normal; no masses,  no organomegaly Extremities: no clubbing, cyanosis, 1+ edema, venous stasis   Lab Results:  Recent Labs  02/20/14 0444 02/21/14 0556  NA 134* 137  K 4.4 4.2  CL 104 103  CO2 24 24  GLUCOSE 119* 105*  BUN 42* 37*  CREATININE 1.34* 1.23*  CALCIUM 8.7 8.6    Recent Labs  02/20/14 0444 02/21/14 0556  AST 20 20  ALT 11 11  ALKPHOS 92 94  BILITOT 1.2 1.4*  PROT 7.1 7.0  ALBUMIN 2.5* 2.7*    Recent Labs  02/20/14 0444 02/21/14 0556  WBC 11.7* 11.3*  HGB 10.1* 10.4*  HCT 34.0* 35.5*  MCV 85.2 86.8  PLT 288 279   Lab Results  Component Value Date   INR 1.70* 02/15/2014   INR 1.74* 04/22/2010   INR 1.59* 04/21/2010     Studies/Results: Dg Chest Port 1 View  02/20/2014   CLINICAL DATA:  Shortness of breath and hypoxia  EXAM: PORTABLE CHEST - 1 VIEW  COMPARISON:  02/18/2014  FINDINGS: Right-sided PICC line is again identified and stable. The cardiac shadow is stable in appearance.  Diffuse pulmonary edema is again identified bilaterally and stable. No new focal infiltrate is seen. No bony abnormality is noted.  IMPRESSION: Stable appearance of the chest when compared with the previous exam.   Electronically Signed   By: Alcide Clever M.D.   On: 02/20/2014 08:05     Medications: Scheduled: . antiseptic oral rinse  7 mL Mouth Rinse BID  . aspirin  81 mg Oral Daily  . atorvastatin  80 mg Oral q1800  . cefTRIAXone (ROCEPHIN)  IV  2 g Intravenous Q24H  . collagenase   Topical Daily  . digoxin  0.0625 mg Oral Daily  . enoxaparin (LOVENOX) injection  30 mg Subcutaneous Q24H  . feeding supplement (GLUCERNA SHAKE)  237 mL Oral TID BM  . furosemide  40 mg Intravenous BID  . gabapentin  600 mg Oral BID  . hydrocerin  1 application Topical BID  . insulin aspart  0-15 Units Subcutaneous TID WC  . insulin aspart  0-5 Units Subcutaneous QHS  . insulin glargine  10 Units Subcutaneous QHS  . lactulose  10 g Oral Daily  . metoprolol succinate  25 mg Oral Daily  . multivitamin with minerals  1 tablet Oral Daily  . nystatin cream   Topical BID  . senna  1 tablet Oral BID  . sodium chloride  10-40 mL Intracatheter Q12H   Continuous:  Assessment/Plan:   Hypercarbic Respiratory Failure     Sepsis/ Group G streptococcal bacteremia   Atrial fibrillation with tachycardic ventricular rate   Type 2 diabetes, uncontrolled, with neuropathy   Morbid obesity   Cellulitis and abscess of leg   Cor pulmonale, chronic   Pulmonary hypertension   Diastolic CHF with preserved left ventricular function, NYHA class 2   Obstructive chronic bronchitis without exacerbation   Smoker   NSTEMI (non-ST elevated myocardial infarction)   Palliative care encounter   Anxiety state   General weakness   Diabetic foot ulcer   Renal failure (ARF), acute on chronic   Severe sepsis with acute organ dysfunction   Cellulitis  Clinically declining with worsening hypotension, terminal agitation,  hypercarbic respiratory failure despite aggressive treatments.  Unable to tolerate po medications or diet.  Discussed with husband.  He is prepared to transition to comfort end-of-life measures.  Will discontinue all meds including antibiotics (not helping), diuretics (hypotension limits) and use MSO4 drip, Ativan/Haldol as needed for agitation.  Appreciate cardiology and Palliative Care assistance.  Anticipate in-hospital death within 24-48 hours.  If stabilizes can consider [facility] on Monday per husband's desires.     LOS: 7 days   Joy Benitez 02/21/2014, 7:36 AM

## 2014-02-22 MED ORDER — ATROPINE SULFATE 1 % OP SOLN
4.0000 [drp] | Freq: Four times a day (QID) | OPHTHALMIC | Status: DC
  Administered 2014-02-22 (×4): 4 [drp] via SUBLINGUAL
  Filled 2014-02-22 (×2): qty 2

## 2014-02-22 NOTE — Progress Notes (Signed)
Pt still non responsive even to pain at this time, BP read 60/40 with a respiratory rate of 14,on a non re breather sating at 90-95%, pt repositioned in bed, will however continue to monitor. Obasogie-Asidi, Joy Benitez

## 2014-02-22 NOTE — Progress Notes (Signed)
Subjective: Patient remains unresponsive though BP has stabilized.    Currently on MSO4 drip 1mg /hr.  Increased respiratory secretions.  Objective: Vital signs in last 24 hours: Temp:  [97.2 F (36.2 C)-98.9 F (37.2 C)] 98.9 F (37.2 C) (01/09 2103) Pulse Rate:  [104-112] 110 (01/09 2103) Resp:  [11-15] 15 (01/09 2103) BP: (105-121)/(51-75) 107/51 mmHg (01/09 2103) SpO2:  [85 %-97 %] 92 % (01/09 2103) Weight change:  Last BM Date: 02/15/14  CBG (last 3)   Recent Labs  02/19/14 2238 02/20/14 0739 02/20/14 2334  GLUCAP 116* 118* 98    Intake/Output from previous day: 01/09 0701 - 01/10 0700 In: 30.3 [I.V.:30.3] Out: 600 [Urine:600] Intake/Output this shift:    General appearance: unresponsive, non-labored breathing pattern; increased oral secretions Eyes: no scleral icterus Throat: oropharynx moist without erythema Resp: increased secretions, rhonchi Cardio: regular rate and rhythm GI: soft, non-tender; bowel sounds normal; no masses,  no organomegaly Extremities: no clubbing, cyanosis; 2 + edema, venous stasis changes  Lab Results:  Recent Labs  02/20/14 0444 02/21/14 0556  NA 134* 137  K 4.4 4.2  CL 104 103  CO2 24 24  GLUCOSE 119* 105*  BUN 42* 37*  CREATININE 1.34* 1.23*  CALCIUM 8.7 8.6    Recent Labs  02/20/14 0444 02/21/14 0556  AST 20 20  ALT 11 11  ALKPHOS 92 94  BILITOT 1.2 1.4*  PROT 7.1 7.0  ALBUMIN 2.5* 2.7*    Recent Labs  02/20/14 0444 02/21/14 0556  WBC 11.7* 11.3*  HGB 10.1* 10.4*  HCT 34.0* 35.5*  MCV 85.2 86.8  PLT 288 279   Lab Results  Component Value Date   INR 1.70* 02/15/2014   INR 1.74* 04/22/2010   INR 1.59* 04/21/2010   No results for input(s): CKTOTAL, CKMB, CKMBINDEX, TROPONINI in the last 72 hours. No results for input(s): TSH, T4TOTAL, T3FREE, THYROIDAB in the last 72 hours.  Invalid input(s): FREET3 No results for input(s): VITAMINB12, FOLATE, FERRITIN, TIBC, IRON, RETICCTPCT in the last 72  hours.  Studies/Results: No results found.   Medications: Scheduled: . antiseptic oral rinse  7 mL Mouth Rinse BID  . collagenase   Topical Daily  . enoxaparin (LOVENOX) injection  30 mg Subcutaneous Q24H  . hydrocerin  1 application Topical BID  . sodium chloride  10-40 mL Intracatheter Q12H   Continuous: . morphine 1 mg/hr (02/21/14 0942)    Assessment/Plan:   Hypercarbic Respiratory Failure   Sepsis/ Group G streptococcal bacteremia  Atrial fibrillation with tachycardic ventricular rate  Type 2 diabetes, uncontrolled, with neuropathy  Morbid obesity  Cellulitis and abscess of leg  Cor pulmonale, chronic  Pulmonary hypertension  Diastolic CHF with preserved left ventricular function, NYHA class 2  Obstructive chronic bronchitis without exacerbation  Smoker  NSTEMI (non-ST elevated myocardial infarction)  Palliative care encounter  Anxiety state  General weakness  Diabetic foot ulcer  Renal failure (ARF), acute on chronic  Severe sepsis with acute organ dysfunction  Cellulitis  Continue comfort measures- currently comfortable on MSO4 drip for respiratory distress, Haldol and Ativan for agitation.  Will add atropine drops for increased secretions.  Will transfer to floor due to stabilization of BP.  Husband has expressed desire for [facility] but I'm not sure she will survive hospitalization.   LOS: 8 days   Martha Clan 02/22/2014, 9:11 AM

## 2014-02-22 NOTE — Plan of Care (Signed)
Problem: Phase I Progression Outcomes Goal: OOB as tolerated unless otherwise ordered Outcome: Not Applicable Date Met:  19/59/74 Patient is comfort care; condition does not warrant patient being up.

## 2014-02-24 LAB — HIV ANTIBODY (ROUTINE TESTING W REFLEX)
HIV 1/O/2 Abs-Index Value: <1 (ref <1.00)
HIV-1/HIV-2 Ab: NONREACTIVE

## 2014-02-25 NOTE — Discharge Summary (Signed)
Physician Discharge Summary  DISCHARGE SUMMARY   Patient ID: Joy Benitez MR#: [id number] DOB/AGE: November 04, 1945 69 y.o.   Attending Physician:Seibert Keeter M  Patient's WGN:FAOZH,YQMV M, MD  Consults:  Palliative care, Cards, ID  Admit date: February 24, 2014 Discharge date: 02/25/2014  Discharge Diagnoses:  Principal Problem:   Sepsis Active Problems:   Atrial fibrillation with tachycardic ventricular rate   Type 2 diabetes, uncontrolled, with neuropathy   Morbid obesity   Cellulitis and abscess of leg   Cor pulmonale, chronic   Pulmonary hypertension   Diastolic CHF with preserved left ventricular function, NYHA class 2   Obstructive chronic bronchitis without exacerbation   Smoker   NSTEMI (non-ST elevated myocardial infarction)   Palliative care encounter   Anxiety state   General weakness   Diabetic foot ulcer   Group G streptococcal infection   Renal failure (ARF), acute on chronic   Severe sepsis with acute organ dysfunction   Cellulitis   Patient Active Problem List   Diagnosis Date Noted  . Cellulitis   . Diabetic foot ulcer 02/17/2014  . Group G streptococcal infection 02/17/2014  . Renal failure (ARF), acute on chronic   . Severe sepsis with acute organ dysfunction   . NSTEMI (non-ST elevated myocardial infarction) 02/16/2014  . Palliative care encounter 02/16/2014  . Anxiety state 02/16/2014  . General weakness 02/16/2014  . MI (myocardial infarction) 24-Feb-2014  . Sepsis Feb 24, 2014  . Acute delirium 11/16/2013  . Diastolic CHF with preserved left ventricular function, NYHA class 2 11/05/2013  . Obstructive chronic bronchitis without exacerbation 11/05/2013  . Smoker 11/05/2013  . Acute panniculitis 11/04/2013  . Atrial fibrillation with tachycardic ventricular rate 11/04/2013  . Community acquired pneumonia 11/04/2013  . Type 2 diabetes, uncontrolled, with neuropathy 11/04/2013  . Polyneuropathy in diabetes(357.2) 11/04/2013  . Morbid obesity  11/04/2013  . Cellulitis and abscess of leg 11/04/2013  . Cor pulmonale, chronic 11/04/2013  . Pulmonary hypertension 11/04/2013  . Anemia 11/04/2013   Past Medical History  Diagnosis Date  . Atrial fibrillation   . Hypertension   . History of blood transfusion 1975    "w/my daughter's birth"  . Chronic diastolic CHF (congestive heart failure), NYHA class 2   . Type II diabetes mellitus   . Anemia   . Anxiety   . Arthritis     "hips down" (11/06/2013)  . Diabetic peripheral neuropathy     Discharged Condition: Deceased.    Hospital Procedures: Ct Foot Left Wo Contrast  02/18/2014   CLINICAL DATA:  Diabetic foot ulcer.  Peripheral neuropathy.  EXAM: CT OF THE LEFT FOOT WITHOUT CONTRAST  TECHNIQUE: Multidetector CT imaging of the left foot was performed according to the standard protocol. Multiplanar CT image reconstructions were also generated.  COMPARISON:  Radiographs dated 02/17/2013 There is a 10 x 14 mm superficial soft tissue ulceration on the dorsum of the foot at the level of the mid first and second metatarsals. There is no appreciable underlying abscess. There is no osteomyelitis. The underlying tendons and muscles appear normal.  The areas extensive motion artifact during the scan but I feel but the study is diagnostic for the area of the ulcer. The hindfoot is not seen due to patient movement during the scan. She was unable to hold still.  FINDINGS: Suboptimal CT scan of the foot with no visualization of the hindfoot. However, there is no evidence of osteomyelitis or abscess of the forefoot. Superficial soft tissue ulceration on the dorsum of the foot.   Electronically  Signed   By: Geanie Cooley M.D.   On: 02/18/2014 15:39   Dg Chest Port 1 View  02/20/2014   CLINICAL DATA:  Shortness of breath and hypoxia  EXAM: PORTABLE CHEST - 1 VIEW  COMPARISON:  02/18/2014  FINDINGS: Right-sided PICC line is again identified and stable. The cardiac shadow is stable in appearance. Diffuse  pulmonary edema is again identified bilaterally and stable. No new focal infiltrate is seen. No bony abnormality is noted.  IMPRESSION: Stable appearance of the chest when compared with the previous exam.   Electronically Signed   By: Alcide Clever M.D.   On: 02/20/2014 08:05   Dg Chest Port 1 View  02/18/2014   CLINICAL DATA:  Right arm PICC line placement. Congestive heart failure.  EXAM: PORTABLE CHEST - 1 VIEW  COMPARISON:  02/15/2014  FINDINGS: Patient is rotated to the left. Low lung volumes noted. A right arm PICC line is seen with tip overlying the distal SVC.  Cardiomegaly and diffuse interstitial edema pattern show no significant change allowing for technical differences.  IMPRESSION: Right arm PICC line tip overlies the distal SVC.   Electronically Signed   By: Myles Rosenthal M.D.   On: 02/18/2014 12:57   Portable Chest 1 View  02/15/2014   CLINICAL DATA:  Shortness of breath.  Morbid obesity.  EXAM: PORTABLE CHEST - 1 VIEW  COMPARISON:  One day prior  FINDINGS: Support apparatus: None  Cardiomediastinal silhouette: Patient moderately rotated left. Cardiomegaly accentuated by AP portable technique.  Pleura: Right costophrenic angle minimally excluded. No gross pleural fluid. No pneumothorax.  Lungs: Low lung volumes with resultant pulmonary interstitial prominence. Improvement in mild interstitial edema. The left lung base is poorly evaluated. Otherwise, no areas of consolidation are identified.  Other: None  IMPRESSION: Improvement in mild congestive heart failure.  Moderately degraded exam, secondary to patient size and positioning.   Electronically Signed   By: Jeronimo Greaves M.D.   On: 02/15/2014 08:22   Dg Chest Port 1 View  13-Mar-2014   CLINICAL DATA:  Shortness of breath an rapid atrial fibrillation.  EXAM: PORTABLE CHEST - 1 VIEW  COMPARISON:  11/09/2013  FINDINGS: Stable cardiac enlargement. Diffuse interstitial edema present. No significant pleural fluid identified.  IMPRESSION: Cardiomegaly  with diffuse interstitial edema.   Electronically Signed   By: Irish Lack M.D.   On: Mar 13, 2014 10:54   Dg Foot Complete Left  02/17/2014   CLINICAL DATA:  Diabetic foot ulcer.  Left foot pain.  EXAM: LEFT FOOT - COMPLETE 3+ VIEW  COMPARISON:  None.  FINDINGS: There is no evidence of fracture or dislocation. Mild spurring of posterior calcaneus is noted. No definite lytic destruction is seen to suggest osteomyelitis no radiopaque foreign body is noted. Soft tissues are unremarkable.  IMPRESSION: No acute abnormality seen in the left foot.   Electronically Signed   By: Roque Lias M.D.   On: 02/17/2014 16:09    History of Present Illness:  69 YO WF with chronic R CHF, Severe Pulmonary HTN/Cor Pulmonale from Obesity Hypoventilation and COPD, Morbid obesity, Chronic AFib refused anticoagulation, poorly controlled DM, presented 03/13/2014 with leg swelling, confusion and fever. She had admission in September for same issues which turned out to be Panniculitis/Leukocytosis/Cellulitis. She was not doing well 1 day PTA and EMS came out and she did not want to come to ED. I got called per husband on day of admit stating she is weak, AFTT, and falling. I asked that he call  [phone] and have her come to ED. In ED Temp > 101, WBC 26K, Lactic Acid elevated and leg cellulitis. Broad Spectrum Abx and Tylenol given and I was called for inpt admission. She has been having bilateral leg pain and redness for the last week. Also has been having shortness of breath and productive cough for 2 days. Hasn't been able to eat anything for 2 days. She also has subjective fevers for a week. As per EMS, she was covered with urine. She also had worsening shortness of breath en route and had HR 140s and was in rapid afib on arrival. Started on Cardizem IV. Dxed c Sepsis. Unable to diurese due to low BP. She is already ruling in for a Demand MI c Trop I 5+ Will need admission to step down  Hospital Course: Admitted 1/2 c  Cellulitis/Sepsis/Demand MI/Afib RVR/Hypotension/fever/DeH/AFTT and Leukocytosis. Trop I max @ 7 and was placed on Hep gtt and was seen in conjunction c Cards. Initial Mental state was poor but improved over 24-48 hrs before worsening again mid week. BPs remained soft and she was aggressively hydrated Blood Cxs came back + = Gram + cocci in chains found in 2 anerobic bottles and 1 aerobic bottle = Group G Strept..  Bariatric bed given Foley placed Skin fold Fungal infections noted and given Nystatin She required FIO2 and continued support. Metformin was held d/t Lactic Acidosis. ID was consulted and helped c Abx choice. Proteus UTI Noted on U Cx was S to most Abx Recheck Blood Cx sent and Negative after Abx started.  Foot Xray - No acute abnormality seen in the left foot. CT - no evidence of osteomyelitis or abscess of the forefoot. Superficial soft tissue ulceration on the dorsum of the foot.  PICC was eventually placed. Repeat echo showed worsening heart function but no obvious Endocarditis. She was DNR from admission as this has always been her wishes as long as I have known her.  She and husband were comfortable with the decision  After an initial improvement in Temp, WBC counts, Labs, Mentation, and BP it was thought that she would have a hard recovery ahead and that the worst was behind her. However she knew she was very ill. She was not suicidal but expressed doubt that she would recover adequately from this illness and expressed that she thought she was dying.  I attempted to focus on QOL and comfort at that time by giving her tools to recover plus comfort meds.  I consulted Palliative Care as well.  She Mentally worsened @ 1/5-1/6 and she developed a respiratory Acidosis.  From all the fluid she received she also had volume overload and by midweek was able to tolerate some diuresis as BP was soft but better.  However it was too late.  Despite addition of BiPAP she continued to  deteriorate (although she did have @ 2 hrs of great interaction with family @ 2-3 nights prior to dying).  By 1/9 it became clear that she was not going to make it and that she was actively dying.  MSO4 and Ativan dosing were increased.  Over the weekend as Comfort measures only were pursued and Abx D/ced the meds became a drip and titrated up. Dr Clelia Croft saw her over the weekend and stated: Clinically declining with worsening hypotension, terminal agitation, hypercarbic respiratory failure despite aggressive treatments. Unable to tolerate po medications or diet. Discussed with husband. He is prepared to transition to comfort end-of-life measures. Will discontinue all meds including  antibiotics (not helping), diuretics (hypotension limits) and use MSO4 drip, Ativan/Haldol as needed for agitation. Atropine was used for secretions. She never made it to [facility].  She died March 01, 2014 at 225 am  20-Expired     Signed: Aldyn Toon M 02/25/2014, 8:50 AM

## 2014-03-16 NOTE — Progress Notes (Addendum)
Pt observed to have stopped breathing with no pulse at 0225, confirmed with a second RN Child psychotherapist Engineer, manufacturing systems). Dr Felipa Eth (on call) paged and notified of pt's passing at 55, pt's family (husband and son) also notified by phone.IV morphine d/c and about 184.13ml wasted with the Charge nurse. [place] donor called with a referral number of [id number] at [phone]. Family not willing to come at this time, body cleaned up an bagged. R.I.P. Obasogie-Asidi, Janaisa Birkland Efe Body taken down to the morgue at [facility]. Obasogie-Asidi, Shafer Swamy Efe

## 2014-03-16 NOTE — Progress Notes (Signed)
Pt observed to have vomited greenish liquid coming from her mouth and nostrils, pt suctioned and cleaned up in bed, breathing shallow and agonal, will continue to monitor. Obasogie-Asidi, Mounir Skipper Efe

## 2014-03-16 DEATH — deceased

## 2016-04-29 IMAGING — CT CT FOOT*L* W/O CM
4 series · 15 of 33 positions shown, 18 images · non-contrast
Comparison: Radiographs dated 02/17/2013 There is a 10 x 14 mm
superficial soft tissue ulceration on the dorsum of the foot at the
level of the mid first and second metatarsals.

CLINICAL DATA: Diabetic foot ulcer.  Peripheral neuropathy.

EXAM:
CT OF THE LEFT FOOT WITHOUT CONTRAST
TECHNIQUE: Multidetector CT imaging of the left foot was performed according to
the standard protocol. Multiplanar CT image reconstructions were
also generated.

[Series 5: lower ext 3.0 u90u · axial · 0.59mm/px · z∈[-1043,-893]mm · 5 of 76 slices shown, 7 images]
[im 13/76  soft-tissue]
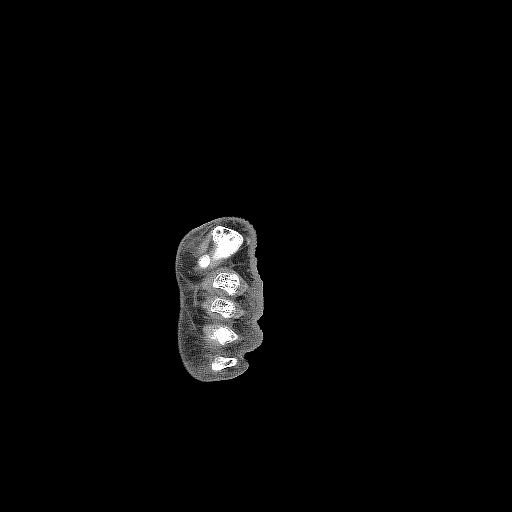
[im 13/76  bone]
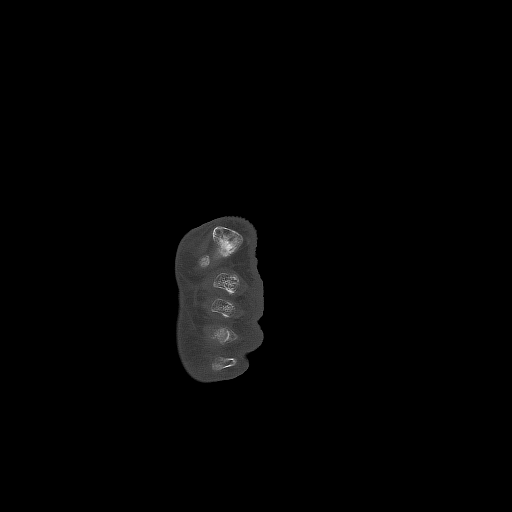
[im 26/76  bone]
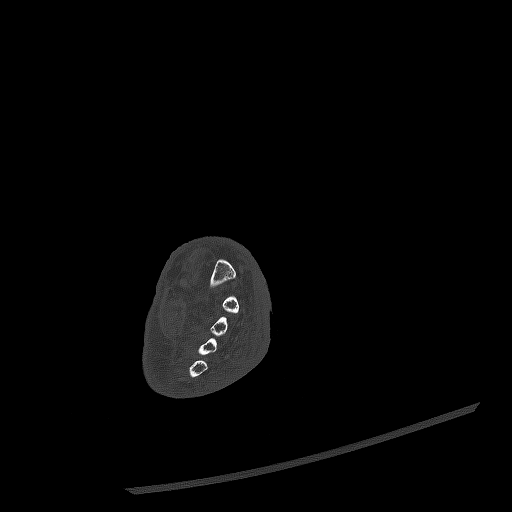
[im 38/76  bone]
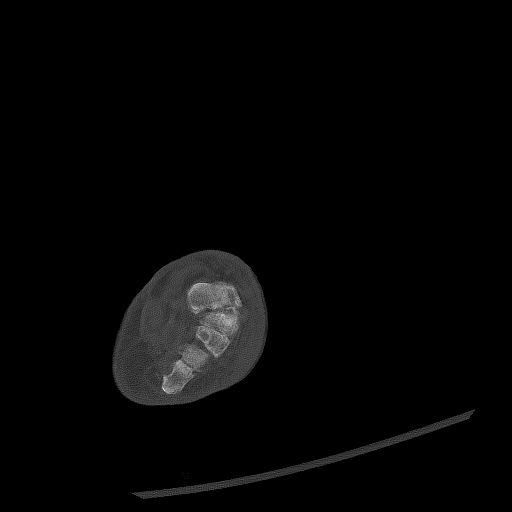
[im 51/76  bone]
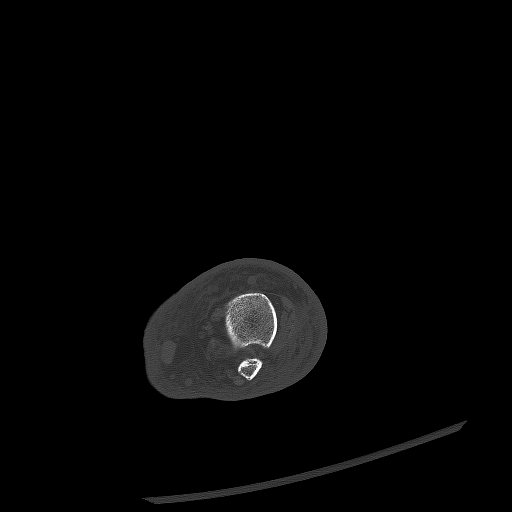
[im 63/76  soft-tissue]
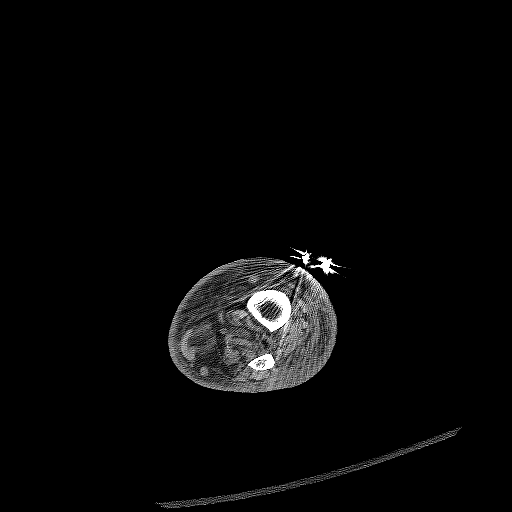
[im 63/76  bone]
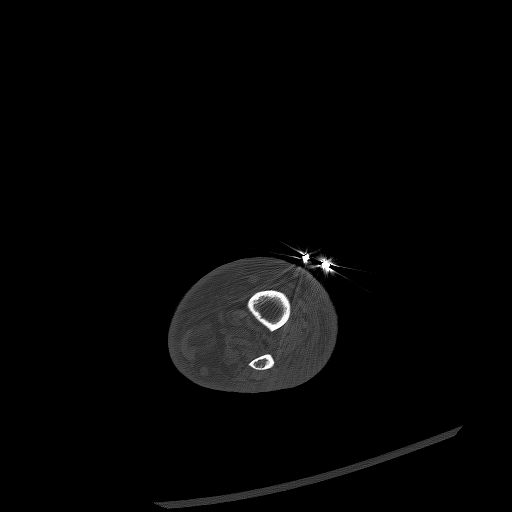

[Series 6: lower ext 3.0 u30u · axial · 0.59mm/px · z∈[-1043,-1004]mm · 2 of 76 slices shown]
[im 13/76  bone]
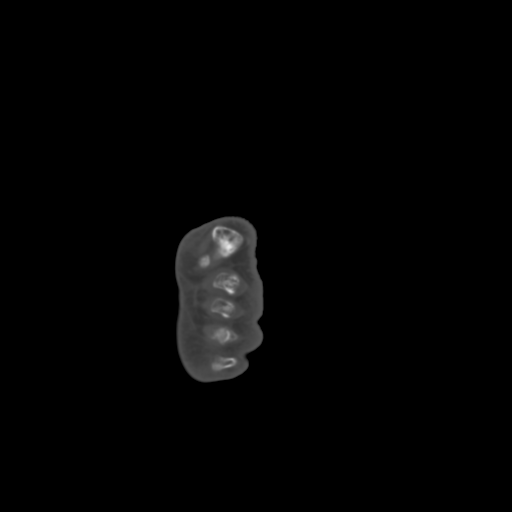
[im 26/76  bone]
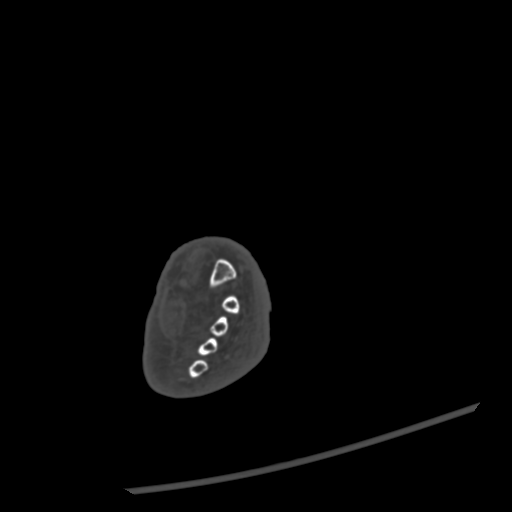

[Series 9: coronal soft tissue · coronal · 0.32mm/px · 3 of 76 slices shown]
[im 16/76  bone]
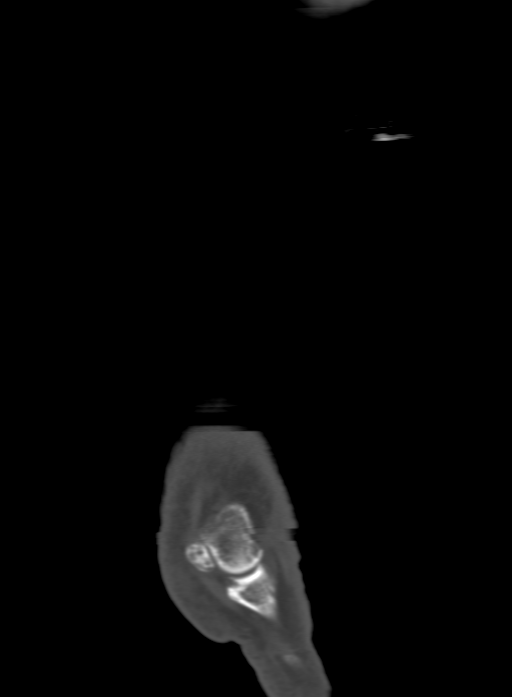
[im 31/76  bone]
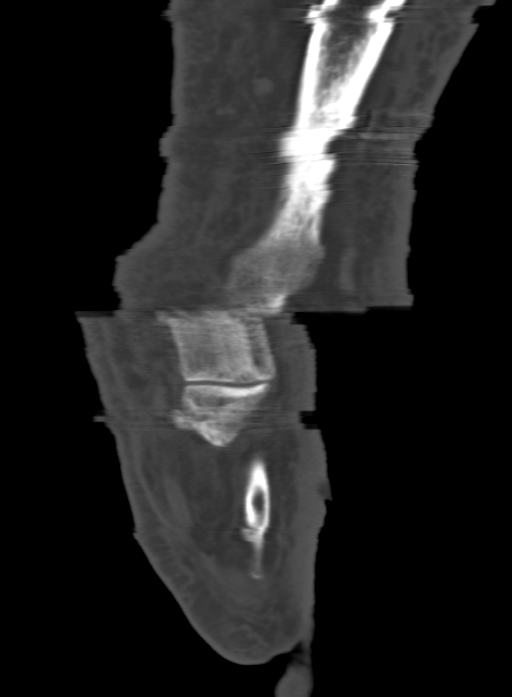
[im 46/76  bone]
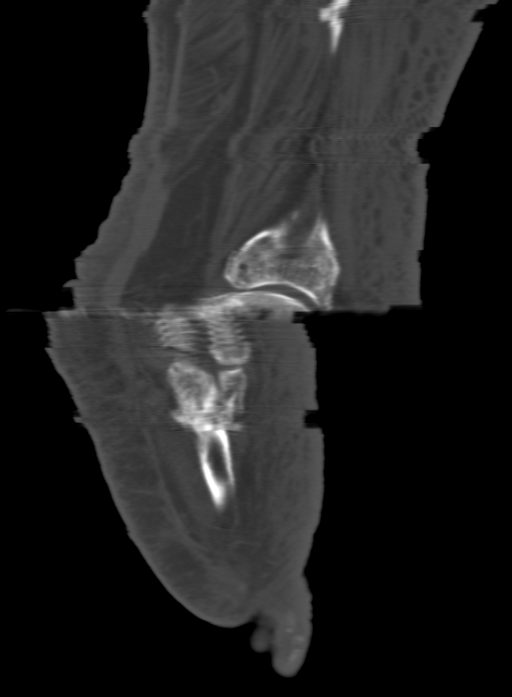

[Series 10: sagittal soft tissue · sagittal · 0.44mm/px · 5 of 48 slices shown, 6 images]
[im 16/48  bone]
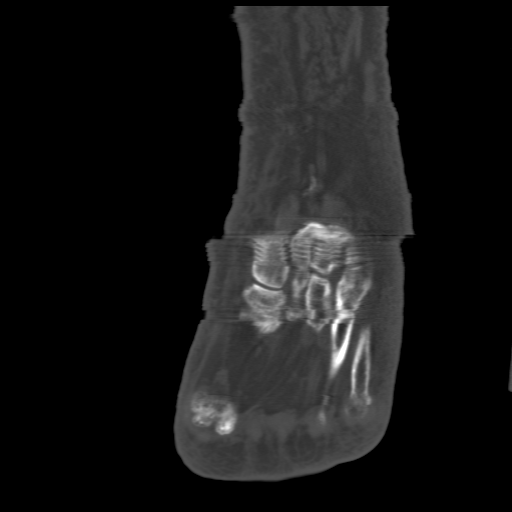
[im 20/48  bone]
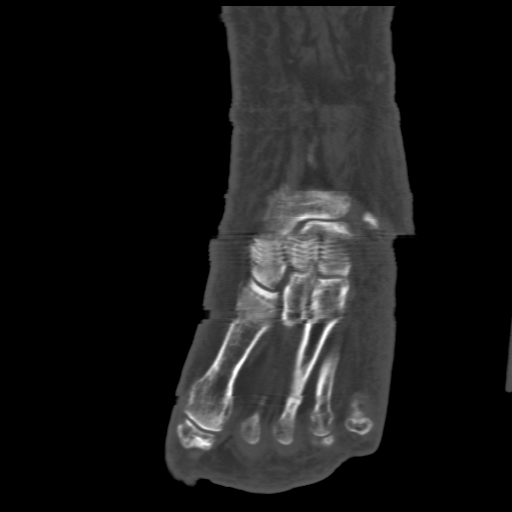
[im 24/48  soft-tissue]
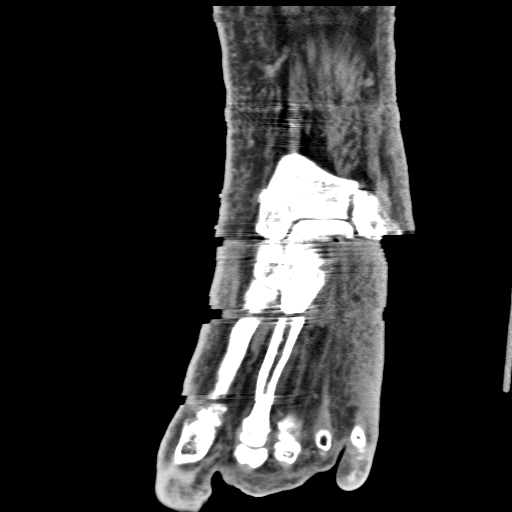
[im 24/48  bone]
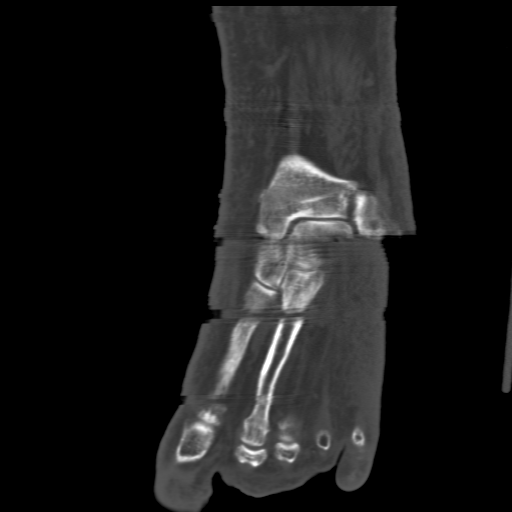
[im 28/48  bone]
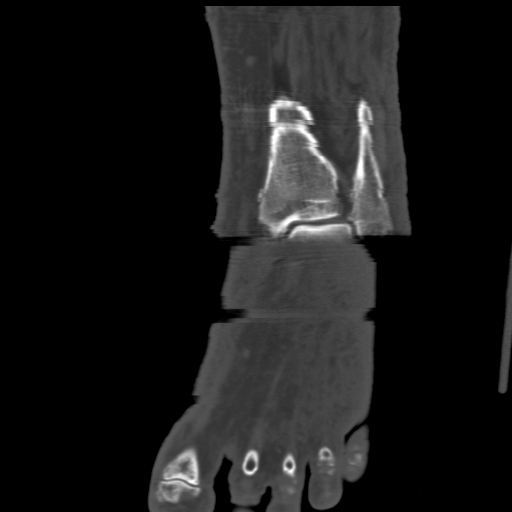
[im 32/48  bone]
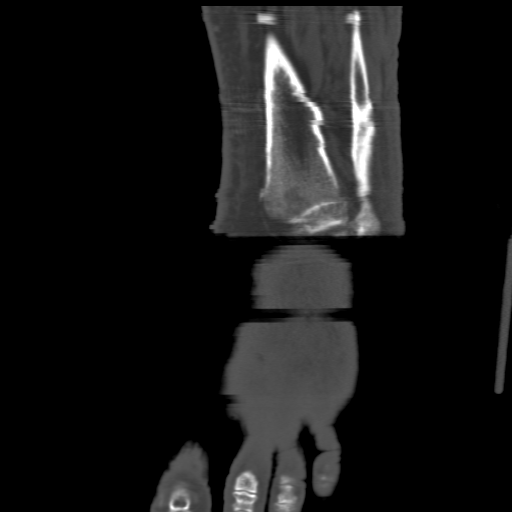

[15 of 33 positions shown; findings below may reference images not displayed]

There is no
appreciable underlying abscess. There is no osteomyelitis. The
underlying tendons and muscles appear normal.

The areas extensive motion artifact during the scan but I feel but
the study is diagnostic for the area of the ulcer. The hindfoot is
not seen due to patient movement during the scan. She was unable to
hold still.
FINDINGS: Suboptimal CT scan of the foot with no visualization of the
hindfoot. However, there is no evidence of osteomyelitis or abscess
of the forefoot. Superficial soft tissue ulceration on the dorsum of
the foot.
